# Patient Record
Sex: Male | Born: 1950 | Race: White | Hispanic: No | Smoking: Former smoker
Health system: Southern US, Community
[De-identification: ages and names within clinical notes are randomized; demographics above are authoritative.]

## PROBLEM LIST (undated history)

## (undated) DIAGNOSIS — J189 Pneumonia, unspecified organism: Secondary | ICD-10-CM

## (undated) DIAGNOSIS — R42 Dizziness and giddiness: Secondary | ICD-10-CM

## (undated) DIAGNOSIS — L729 Follicular cyst of the skin and subcutaneous tissue, unspecified: Secondary | ICD-10-CM

## (undated) DIAGNOSIS — R202 Paresthesia of skin: Secondary | ICD-10-CM

## (undated) DIAGNOSIS — Z9289 Personal history of other medical treatment: Secondary | ICD-10-CM

## (undated) DIAGNOSIS — J449 Chronic obstructive pulmonary disease, unspecified: Secondary | ICD-10-CM

## (undated) DIAGNOSIS — F329 Major depressive disorder, single episode, unspecified: Secondary | ICD-10-CM

## (undated) DIAGNOSIS — Z8709 Personal history of other diseases of the respiratory system: Secondary | ICD-10-CM

## (undated) DIAGNOSIS — R51 Headache: Secondary | ICD-10-CM

## (undated) DIAGNOSIS — M1990 Unspecified osteoarthritis, unspecified site: Secondary | ICD-10-CM

## (undated) DIAGNOSIS — R233 Spontaneous ecchymoses: Secondary | ICD-10-CM

## (undated) DIAGNOSIS — I219 Acute myocardial infarction, unspecified: Secondary | ICD-10-CM

## (undated) DIAGNOSIS — R238 Other skin changes: Secondary | ICD-10-CM

## (undated) DIAGNOSIS — M549 Dorsalgia, unspecified: Secondary | ICD-10-CM

## (undated) DIAGNOSIS — I639 Cerebral infarction, unspecified: Secondary | ICD-10-CM

## (undated) DIAGNOSIS — R351 Nocturia: Secondary | ICD-10-CM

## (undated) DIAGNOSIS — F32A Depression, unspecified: Secondary | ICD-10-CM

## (undated) DIAGNOSIS — R454 Irritability and anger: Secondary | ICD-10-CM

## (undated) HISTORY — PX: LEG SURGERY: SHX1003

---

## 1999-04-09 ENCOUNTER — Encounter: Payer: Self-pay | Admitting: Emergency Medicine

## 1999-04-09 ENCOUNTER — Emergency Department (HOSPITAL_COMMUNITY): Admission: EM | Admit: 1999-04-09 | Discharge: 1999-04-09 | Payer: Self-pay | Admitting: Emergency Medicine

## 2004-06-16 ENCOUNTER — Emergency Department (HOSPITAL_COMMUNITY): Admission: EM | Admit: 2004-06-16 | Discharge: 2004-06-16 | Payer: Self-pay

## 2013-06-10 DIAGNOSIS — J189 Pneumonia, unspecified organism: Secondary | ICD-10-CM

## 2013-06-10 HISTORY — DX: Pneumonia, unspecified organism: J18.9

## 2013-07-10 ENCOUNTER — Other Ambulatory Visit (HOSPITAL_COMMUNITY): Payer: Self-pay | Admitting: Interventional Radiology

## 2013-07-10 DIAGNOSIS — F1010 Alcohol abuse, uncomplicated: Secondary | ICD-10-CM

## 2013-07-11 ENCOUNTER — Ambulatory Visit (HOSPITAL_COMMUNITY)
Admission: RE | Admit: 2013-07-11 | Discharge: 2013-07-11 | Disposition: A | Discharge status: Home / Self Care | Payer: BC Managed Care – PPO | Source: Ambulatory Visit | Attending: Interventional Radiology | Admitting: Interventional Radiology

## 2013-07-11 DIAGNOSIS — F1010 Alcohol abuse, uncomplicated: Secondary | ICD-10-CM

## 2013-09-12 ENCOUNTER — Other Ambulatory Visit (HOSPITAL_COMMUNITY): Payer: Self-pay | Admitting: Interventional Radiology

## 2013-09-12 DIAGNOSIS — I6509 Occlusion and stenosis of unspecified vertebral artery: Secondary | ICD-10-CM

## 2013-09-12 DIAGNOSIS — I6529 Occlusion and stenosis of unspecified carotid artery: Secondary | ICD-10-CM

## 2013-09-13 ENCOUNTER — Other Ambulatory Visit (HOSPITAL_COMMUNITY): Payer: Self-pay | Admitting: Radiology

## 2013-09-14 ENCOUNTER — Other Ambulatory Visit (HOSPITAL_COMMUNITY): Payer: Self-pay | Admitting: Radiology

## 2013-09-18 ENCOUNTER — Encounter (HOSPITAL_COMMUNITY): Payer: Self-pay | Admitting: Pharmacist

## 2013-09-19 ENCOUNTER — Encounter (HOSPITAL_COMMUNITY): Payer: Self-pay

## 2013-09-19 ENCOUNTER — Other Ambulatory Visit (HOSPITAL_COMMUNITY): Payer: Self-pay | Admitting: Interventional Radiology

## 2013-09-19 ENCOUNTER — Ambulatory Visit (HOSPITAL_COMMUNITY)
Admission: RE | Admit: 2013-09-19 | Discharge: 2013-09-19 | Disposition: A | Discharge status: Home / Self Care | Payer: BC Managed Care – PPO | Source: Ambulatory Visit | Attending: Interventional Radiology | Admitting: Interventional Radiology

## 2013-09-19 DIAGNOSIS — I6529 Occlusion and stenosis of unspecified carotid artery: Secondary | ICD-10-CM | POA: Insufficient documentation

## 2013-09-19 DIAGNOSIS — Z87891 Personal history of nicotine dependence: Secondary | ICD-10-CM | POA: Insufficient documentation

## 2013-09-19 DIAGNOSIS — G450 Vertebro-basilar artery syndrome: Secondary | ICD-10-CM | POA: Insufficient documentation

## 2013-09-19 DIAGNOSIS — I6509 Occlusion and stenosis of unspecified vertebral artery: Secondary | ICD-10-CM

## 2013-09-19 DIAGNOSIS — Z8673 Personal history of transient ischemic attack (TIA), and cerebral infarction without residual deficits: Secondary | ICD-10-CM | POA: Insufficient documentation

## 2013-09-19 DIAGNOSIS — J4489 Other specified chronic obstructive pulmonary disease: Secondary | ICD-10-CM | POA: Insufficient documentation

## 2013-09-19 DIAGNOSIS — I2510 Atherosclerotic heart disease of native coronary artery without angina pectoris: Secondary | ICD-10-CM | POA: Insufficient documentation

## 2013-09-19 DIAGNOSIS — I252 Old myocardial infarction: Secondary | ICD-10-CM | POA: Insufficient documentation

## 2013-09-19 DIAGNOSIS — J449 Chronic obstructive pulmonary disease, unspecified: Secondary | ICD-10-CM | POA: Insufficient documentation

## 2013-09-19 HISTORY — DX: Headache: R51

## 2013-09-19 HISTORY — DX: Cerebral infarction, unspecified: I63.9

## 2013-09-19 HISTORY — DX: Acute myocardial infarction, unspecified: I21.9

## 2013-09-19 HISTORY — DX: Chronic obstructive pulmonary disease, unspecified: J44.9

## 2013-09-19 LAB — CBC WITH DIFFERENTIAL/PLATELET
BASOS PCT: 0 % (ref 0–1)
Basophils Absolute: 0 10*3/uL (ref 0.0–0.1)
EOS PCT: 4 % (ref 0–5)
Eosinophils Absolute: 0.4 10*3/uL (ref 0.0–0.7)
HEMATOCRIT: 32.3 % — AB (ref 39.0–52.0)
HEMOGLOBIN: 10.9 g/dL — AB (ref 13.0–17.0)
Lymphocytes Relative: 46 % (ref 12–46)
Lymphs Abs: 4.1 10*3/uL — ABNORMAL HIGH (ref 0.7–4.0)
MCH: 29.5 pg (ref 26.0–34.0)
MCHC: 33.7 g/dL (ref 30.0–36.0)
MCV: 87.3 fL (ref 78.0–100.0)
MONO ABS: 0.7 10*3/uL (ref 0.1–1.0)
Monocytes Relative: 8 % (ref 3–12)
Neutro Abs: 3.7 10*3/uL (ref 1.7–7.7)
Neutrophils Relative %: 42 % — ABNORMAL LOW (ref 43–77)
Platelets: 256 10*3/uL (ref 150–400)
RBC: 3.7 MIL/uL — ABNORMAL LOW (ref 4.22–5.81)
RDW: 13.9 % (ref 11.5–15.5)
WBC: 8.8 10*3/uL (ref 4.0–10.5)

## 2013-09-19 LAB — PROTIME-INR
INR: 1.07 (ref 0.00–1.49)
Prothrombin Time: 13.7 seconds (ref 11.6–15.2)

## 2013-09-19 LAB — BASIC METABOLIC PANEL
BUN: 9 mg/dL (ref 6–23)
CHLORIDE: 104 meq/L (ref 96–112)
CO2: 26 mEq/L (ref 19–32)
Calcium: 9.1 mg/dL (ref 8.4–10.5)
Creatinine, Ser: 0.69 mg/dL (ref 0.50–1.35)
GFR calc Af Amer: >90 mL/min (ref >90)
GLUCOSE: 91 mg/dL (ref 70–99)
Potassium: 4.5 mEq/L (ref 3.7–5.3)
Sodium: 140 mEq/L (ref 137–147)

## 2013-09-19 LAB — APTT: aPTT: 35 seconds (ref 24–37)

## 2013-09-19 MED ORDER — FENTANYL CITRATE 0.05 MG/ML IJ SOLN
INTRAMUSCULAR | Status: AC | PRN
  Administered 2013-09-19 (×2): 25 ug via INTRAVENOUS

## 2013-09-19 MED ORDER — SODIUM CHLORIDE 0.9 % IV SOLN
Freq: Once | INTRAVENOUS | Status: AC
  Administered 2013-09-19: 10:00:00 via INTRAVENOUS

## 2013-09-19 MED ORDER — IOHEXOL 300 MG/ML  SOLN
150.0000 mL | Freq: Once | INTRAMUSCULAR | Status: AC | PRN
  Administered 2013-09-19: 65 mL via INTRA_ARTERIAL

## 2013-09-19 MED ORDER — SODIUM CHLORIDE 0.9 % IV SOLN: INTRAVENOUS | Status: AC

## 2013-09-19 MED ORDER — MIDAZOLAM HCL 2 MG/2ML IJ SOLN
INTRAMUSCULAR | Status: AC | PRN
  Administered 2013-09-19: 1 mg via INTRAVENOUS

## 2013-09-19 MED ORDER — HEPARIN SOD (PORK) LOCK FLUSH 100 UNIT/ML IV SOLN
INTRAVENOUS | Status: AC | PRN
  Administered 2013-09-19 (×2): 500 [IU] via INTRAVENOUS

## 2013-09-19 MED ORDER — FENTANYL CITRATE 0.05 MG/ML IJ SOLN
INTRAMUSCULAR | Status: AC
  Filled 2013-09-19: qty 2

## 2013-09-19 MED ORDER — MIDAZOLAM HCL 2 MG/2ML IJ SOLN
INTRAMUSCULAR | Status: AC
  Filled 2013-09-19: qty 2

## 2013-09-19 NOTE — H&P (Signed)
Vincent MoodJohn M Schamberger is an 63 y.o. male.   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 Coronary artery disease   . Stroke   . Headache(784.0)   . Myocardial infarction   . COPD (chronic obstructive pulmonary disease)     History reviewed. No pertinent past surgical history.  History reviewed. No pertinent family history. Social History:  reports that he has quit smoking. He does not have any smokeless tobacco history on file. His alcohol and drug histories are not on file.  Allergies: No Known Allergies   (Not in a hospital admission)  Results for orders placed during the hospital encounter of 09/19/13 (from the past 48 hour(s))  APTT     Status: None   Collection Time    09/19/13 10:08 AM      Result Value Range   aPTT 35  24 - 37 seconds  CBC WITH DIFFERENTIAL     Status: Abnormal   Collection Time    09/19/13 10:08 AM      Result Value Range   WBC 8.8  4.0 - 10.5 K/uL   RBC 3.70 (*) 4.22 - 5.81 MIL/uL   Hemoglobin 10.9 (*) 13.0 - 17.0 g/dL   HCT 91.432.3 (*) 78.239.0 - 95.652.0 %   MCV 87.3  78.0 - 100.0 fL   MCH 29.5  26.0 - 34.0 pg   MCHC 33.7  30.0 - 36.0 g/dL   RDW 21.313.9  08.611.5 - 57.815.5 %   Platelets 256  150 - 400 K/uL   Neutrophils Relative % 42 (*) 43 - 77 %   Neutro Abs 3.7  1.7 - 7.7 K/uL   Lymphocytes Relative 46  12 - 46 %   Lymphs Abs 4.1 (*) 0.7 - 4.0 K/uL   Monocytes Relative 8  3 - 12 %   Monocytes Absolute  0.7  0.1 - 1.0 K/uL   Eosinophils Relative 4  0 - 5 %   Eosinophils Absolute 0.4  0.0 - 0.7 K/uL   Basophils Relative 0  0 - 1 %   Basophils Absolute 0.0  0.0 - 0.1 K/uL  PROTIME-INR     Status: None   Collection Time    09/19/13 10:08 AM      Result Value Range   Prothrombin Time 13.7  11.6 - 15.2 seconds   INR 1.07  0.00 - 1.49   No results found.  Review of Systems  Constitutional: Positive for weight loss. Negative for fever.  Eyes: Negative for blurred vision and double vision.  Respiratory: Positive for cough.   Cardiovascular: Negative for chest pain.  Gastrointestinal: Negative for nausea, vomiting and abdominal pain.  Neurological: Positive for dizziness, weakness and headaches.  Psychiatric/Behavioral: Positive for substance abuse. The patient is not nervous/anxious.     Blood pressure 130/60, pulse 86, temperature 97.5 F (36.4 C), temperature source Oral, resp. rate 16, height 5\' 8"  (1.727 m), weight 121 lb (54.885 kg), SpO2 100.00%. Physical Exam  Constitutional: He is oriented to person, place, and time. He appears well-developed.  thin  Cardiovascular: Normal rate and regular rhythm.   No murmur heard. Respiratory: Effort normal and breath sounds normal. He has no wheezes.  GI: Soft. Bowel sounds are normal. There is no tenderness.  Musculoskeletal: Normal range of motion.  Neurological: He is alert and oriented to person, place, and time.  Skin: Skin is warm and dry.  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 His behavior is normal. Judgment and thought content normal.     Assessment/Plan Pt with ataxia; dizziness and headaches x over 1 yr Hospitalized and worked up in [facility] CT and MRI reveal R ICA occl; L ICA mild stenosis; L VA 75% stenosis Consult with Dr Corliss Skains 07/2103 Now scheduled for cerebral arteriogram Pt and wife aware of procedure benefits and risks and agreeable to proceed Consent signed and in  chart  Ruchi Stoney A 09/19/2013, 10:45 AM

## 2013-09-19 NOTE — Discharge Instructions (Signed)
Angiography, Care After ° °Refer to this sheet in the next few weeks. These instructions provide you with information on caring for yourself after your procedure. Your health care provider may also give you more specific instructions. Your treatment has been planned according to current medical practices, but problems sometimes occur. Call your health care provider if you have any problems or questions after your procedure.  °WHAT TO EXPECT AFTER THE PROCEDURE °After your procedure, it is typical to have the following sensations: °· Minor discomfort or tenderness and a small bump at the catheter insertion site. The bump should usually decrease in size and tenderness within 1 to 2 weeks. °· Any bruising will usually fade within 2 to 4 weeks. °HOME CARE INSTRUCTIONS  °· You may need to keep taking blood thinners if they were prescribed for you. Only take over-the-counter or prescription medicines for pain, fever, or discomfort as directed by your health care provider. °· Do not apply powder or lotion to the site. °· Do not sit in a bathtub, swimming pool, or whirlpool for 5 to 7 days. °· You may shower 24 hours after the procedure. Remove the bandage (dressing) and gently wash the site with plain soap and water. Gently pat the site dry. °· Inspect the site at least twice daily. °· Limit your activity for the first 24 hours. Do not bend, squat, or lift anything over 10 lb (9 kg) or as directed by your health care provider. °· Do not drive home if you are discharged the day of the procedure. Have someone else drive you. Follow instructions about when you can drive or return to work. °SEEK MEDICAL CARE IF: °· You get lightheaded when standing up. °· You have drainage (other than a small amount of blood on the dressing). °· You have chills. °· You have a fever. °· You have redness, warmth, swelling, or pain at the insertion site. °SEEK IMMEDIATE MEDICAL CARE IF:  °· You develop chest pain or shortness of breath, feel  faint, or pass out. °· You have bleeding, swelling larger than a walnut, or drainage from the catheter insertion site. °· You develop pain, discoloration, coldness, or severe bruising in the leg or arm that held the catheter. °· You develop bleeding from any other place, such as the bowels. You may see bright red blood in your urine or stools, or your stools may appear black and tarry. °· You have heavy bleeding from the site. If this happens, hold pressure on the site. °MAKE SURE YOU: °· Understand these instructions. °· Will watch your condition. °· Will get help right away if you are not doing well or get worse. °Document Released: 02/12/2005 Document Revised: 03/29/2013 Document Reviewed: 12/19/2012 °ExitCare® Patient Information ©2014 [employer]. ° °

## 2013-09-19 NOTE — Procedures (Signed)
S/P 4 vessel cerebral arteriogram  RT CFa approach. Findings. 1.Occluded RT CCA . 2.Occluded RT VBJ . 3limited VA stenosis at the origin of approx  70 %

## 2013-09-22 ENCOUNTER — Other Ambulatory Visit (HOSPITAL_COMMUNITY): Payer: Self-pay | Admitting: Interventional Radiology

## 2013-09-22 DIAGNOSIS — I771 Stricture of artery: Secondary | ICD-10-CM

## 2013-09-26 ENCOUNTER — Ambulatory Visit (HOSPITAL_COMMUNITY): Admission: RE | Admit: 2013-09-26 | Payer: BC Managed Care – PPO | Source: Ambulatory Visit

## 2013-09-28 ENCOUNTER — Ambulatory Visit (HOSPITAL_COMMUNITY)
Admission: RE | Admit: 2013-09-28 | Discharge: 2013-09-28 | Disposition: A | Discharge status: Home / Self Care | Payer: BC Managed Care – PPO | Source: Ambulatory Visit | Attending: Interventional Radiology | Admitting: Interventional Radiology

## 2013-09-28 DIAGNOSIS — I771 Stricture of artery: Secondary | ICD-10-CM

## 2013-10-06 ENCOUNTER — Encounter (HOSPITAL_COMMUNITY): Payer: Self-pay | Admitting: Pharmacy Technician

## 2013-10-06 ENCOUNTER — Other Ambulatory Visit (HOSPITAL_COMMUNITY): Payer: Self-pay | Admitting: Interventional Radiology

## 2013-10-06 DIAGNOSIS — I639 Cerebral infarction, unspecified: Secondary | ICD-10-CM

## 2013-10-06 DIAGNOSIS — R55 Syncope and collapse: Secondary | ICD-10-CM

## 2013-10-09 ENCOUNTER — Telehealth (HOSPITAL_COMMUNITY): Payer: Self-pay | Admitting: Interventional Radiology

## 2013-10-09 ENCOUNTER — Encounter (HOSPITAL_COMMUNITY): Payer: Self-pay | Admitting: Pharmacy Technician

## 2013-10-09 NOTE — Telephone Encounter (Signed)
Called pt gave Jacki ConesLaurie (pt's fiance) instructions for pt's upcoming procedure. She states understanding of all of the instruction. Pt was instructed to restart his Plavix 75mg  1 q d today (10/09/13) and he is and has been on Aspirin 81mg  1 q d. Edmonia CaprioJM

## 2013-10-12 ENCOUNTER — Other Ambulatory Visit: Payer: Self-pay | Admitting: Radiology

## 2013-10-13 ENCOUNTER — Encounter (HOSPITAL_COMMUNITY)
Admission: RE | Admit: 2013-10-13 | Discharge: 2013-10-13 | Disposition: A | Discharge status: Home / Self Care | Payer: BC Managed Care – PPO | Source: Ambulatory Visit | Attending: Anesthesiology | Admitting: Anesthesiology

## 2013-10-13 ENCOUNTER — Encounter (HOSPITAL_COMMUNITY)
Admission: RE | Admit: 2013-10-13 | Discharge: 2013-10-13 | Disposition: A | Discharge status: Home / Self Care | Payer: BC Managed Care – PPO | Source: Ambulatory Visit | Attending: Interventional Radiology | Admitting: Interventional Radiology

## 2013-10-13 ENCOUNTER — Encounter (HOSPITAL_COMMUNITY): Payer: Self-pay

## 2013-10-13 HISTORY — DX: Irritability and anger: R45.4

## 2013-10-13 HISTORY — DX: Depression, unspecified: F32.A

## 2013-10-13 HISTORY — DX: Unspecified osteoarthritis, unspecified site: M19.90

## 2013-10-13 HISTORY — DX: Dorsalgia, unspecified: M54.9

## 2013-10-13 HISTORY — DX: Other skin changes: R23.8

## 2013-10-13 HISTORY — DX: Paresthesia of skin: R20.2

## 2013-10-13 HISTORY — DX: Pneumonia, unspecified organism: J18.9

## 2013-10-13 HISTORY — DX: Dizziness and giddiness: R42

## 2013-10-13 HISTORY — DX: Major depressive disorder, single episode, unspecified: F32.9

## 2013-10-13 HISTORY — DX: Nocturia: R35.1

## 2013-10-13 HISTORY — DX: Follicular cyst of the skin and subcutaneous tissue, unspecified: L72.9

## 2013-10-13 HISTORY — DX: Personal history of other medical treatment: Z92.89

## 2013-10-13 HISTORY — DX: Personal history of other diseases of the respiratory system: Z87.09

## 2013-10-13 HISTORY — DX: Spontaneous ecchymoses: R23.3

## 2013-10-13 LAB — COMPREHENSIVE METABOLIC PANEL
ALK PHOS: 113 U/L (ref 39–117)
ALT: 13 U/L (ref 0–53)
AST: 13 U/L (ref 0–37)
Albumin: 3.8 g/dL (ref 3.5–5.2)
BILIRUBIN TOTAL: 0.3 mg/dL (ref 0.3–1.2)
BUN: 10 mg/dL (ref 6–23)
CHLORIDE: 102 meq/L (ref 96–112)
CO2: 26 meq/L (ref 19–32)
Calcium: 9.5 mg/dL (ref 8.4–10.5)
Creatinine, Ser: 0.63 mg/dL (ref 0.50–1.35)
GFR calc non Af Amer: >90 mL/min (ref >90)
GLUCOSE: 121 mg/dL — AB (ref 70–99)
POTASSIUM: 5.4 meq/L — AB (ref 3.7–5.3)
SODIUM: 142 meq/L (ref 137–147)
Total Protein: 7.4 g/dL (ref 6.0–8.3)

## 2013-10-13 LAB — CBC WITH DIFFERENTIAL/PLATELET
BASOS ABS: 0 10*3/uL (ref 0.0–0.1)
Basophils Relative: 0 % (ref 0–1)
Eosinophils Absolute: 0 10*3/uL (ref 0.0–0.7)
Eosinophils Relative: 0 % (ref 0–5)
HCT: 37.1 % — ABNORMAL LOW (ref 39.0–52.0)
HEMOGLOBIN: 12.4 g/dL — AB (ref 13.0–17.0)
LYMPHS ABS: 3 10*3/uL (ref 0.7–4.0)
LYMPHS PCT: 29 % (ref 12–46)
MCH: 29.5 pg (ref 26.0–34.0)
MCHC: 33.4 g/dL (ref 30.0–36.0)
MCV: 88.3 fL (ref 78.0–100.0)
MONO ABS: 0.3 10*3/uL (ref 0.1–1.0)
MONOS PCT: 3 % (ref 3–12)
NEUTROS ABS: 6.9 10*3/uL (ref 1.7–7.7)
Neutrophils Relative %: 67 % (ref 43–77)
Platelets: 292 10*3/uL (ref 150–400)
RBC: 4.2 MIL/uL — AB (ref 4.22–5.81)
RDW: 13.9 % (ref 11.5–15.5)
WBC: 10.2 10*3/uL (ref 4.0–10.5)

## 2013-10-13 LAB — APTT: APTT: 32 s (ref 24–37)

## 2013-10-13 LAB — PROTIME-INR
INR: 0.98 (ref 0.00–1.49)
Prothrombin Time: 12.8 seconds (ref 11.6–15.2)

## 2013-10-13 NOTE — Progress Notes (Addendum)
Cardiologist with [facility]-was in hospital in Nov 2014-cardiologist saw him there; to request last office visit  Stress test done [facility] 3[facility]6544yrs [facility] [address]  Unsure of echo being done but to [facility]   Denies ever having a heart cath  Medical Md is Dr.Lakshmi Paruchuri   CXR has been done-called [facility] and they have gone for the day CXR to be done today  [facility]

## 2013-10-13 NOTE — Pre-Procedure Instructions (Signed)
Vincent MoodJohn M Payne  10/13/2013   Your procedure is scheduled on:  Mon, Mar 9 @ 8:00 AM  [facility]  at 6:00 AM.  Call this number if you have problems the morning of surgery: [phone]   Remember:   Do not eat food or drink liquids after midnight.   Take these medicines the morning of surgery with A SIP OF WATER: Pain Pill(if needed),Aspirin,and Plavix              No Goody's,BC's,Aleve,Ibuprofen,Fish Oil,or any Herbal Medications   Do not wear jewelry  Do not wear lotions, powders, or colognes. You may wear deodorant.  Men may shave face and neck.  Do not bring valuables to the hospital.  [facility] is not responsible                  for any belongings or valuables.               Contacts, dentures or bridgework may not be worn into surgery.  Leave suitcase in the car. After surgery it may be brought to your room.  For patients admitted to the hospital, discharge time is determined by your                treatment team.               Patients discharged the day of surgery will not be allowed to drive  home.    Special Instructions:  [place] - Preparing for Surgery  Before surgery, you can play an important role.  Because skin is not sterile, your skin needs to be as free of germs as possible.  You can reduce the number of germs on you skin by washing with CHG (chlorahexidine gluconate) soap before surgery.  CHG is an antiseptic cleaner which kills germs and bonds with the skin to continue killing germs even after washing.  Please DO NOT use if you have an allergy to CHG or antibacterial soaps.  If your skin becomes reddened/irritated stop using the CHG and inform your nurse when you arrive at Short Stay.  Do not shave (including legs and underarms) for at least 48 hours prior to the first CHG shower.  You may shave your face.  Please follow these instructions carefully:   1.  Shower with CHG Soap the night before surgery and the                                 morning of Surgery.  2.  If you choose to wash your hair, wash your hair first as usual with your       normal shampoo.  3.  After you shampoo, rinse your hair and body thoroughly to remove the                      Shampoo.  4.  Use CHG as you would any other liquid soap.  You can apply chg directly       to the skin and wash gently with scrungie or a clean washcloth.  5.  Apply the CHG Soap to your body ONLY FROM THE NECK DOWN.        Do not use on open wounds or open sores.  Avoid contact with your eyes,       ears, mouth and genitals (private parts).  Wash genitals (private  parts)       with your normal soap.  6.  Wash thoroughly, paying special attention to the area where your surgery        will be performed.  7.  Thoroughly rinse your body with warm water from the neck down.  8.  DO NOT shower/wash with your normal soap after using and rinsing off       the CHG Soap.  9.  Pat yourself dry with a clean towel.            10.  Wear clean pajamas.            11.  Place clean sheets on your bed the night of your first shower and do not        sleep with pets.  Day of Surgery  Do not apply any lotions/deoderants the morning of surgery.  Please wear clean clothes to the hospital/surgery center.     Please read over the following fact sheets that you were given: Pain Booklet, Coughing and Deep Breathing and Surgical Site Infection Prevention

## 2013-10-16 ENCOUNTER — Encounter (HOSPITAL_COMMUNITY): Payer: Self-pay | Admitting: Anesthesiology

## 2013-10-16 ENCOUNTER — Ambulatory Visit (HOSPITAL_COMMUNITY): Payer: BC Managed Care – PPO | Admitting: Certified Registered Nurse Anesthetist

## 2013-10-16 ENCOUNTER — Encounter (HOSPITAL_COMMUNITY)
Admission: RE | Disposition: A | Discharge status: Home / Self Care | Payer: Self-pay | Source: Ambulatory Visit | Attending: Interventional Radiology

## 2013-10-16 ENCOUNTER — Ambulatory Visit (HOSPITAL_COMMUNITY)
Admission: RE | Admit: 2013-10-16 | Discharge: 2013-10-16 | Disposition: A | Discharge status: Home / Self Care | Payer: BC Managed Care – PPO | Source: Ambulatory Visit | Attending: Interventional Radiology | Admitting: Interventional Radiology

## 2013-10-16 ENCOUNTER — Encounter (HOSPITAL_COMMUNITY): Payer: Self-pay

## 2013-10-16 ENCOUNTER — Encounter (HOSPITAL_COMMUNITY): Payer: BC Managed Care – PPO | Admitting: Certified Registered Nurse Anesthetist

## 2013-10-16 ENCOUNTER — Observation Stay (HOSPITAL_COMMUNITY)
Admission: RE | Admit: 2013-10-16 | Discharge: 2013-10-17 | Disposition: A | Discharge status: Home / Self Care | Payer: BC Managed Care – PPO | Source: Ambulatory Visit | Attending: Interventional Radiology | Admitting: Interventional Radiology

## 2013-10-16 DIAGNOSIS — R55 Syncope and collapse: Secondary | ICD-10-CM

## 2013-10-16 DIAGNOSIS — I6509 Occlusion and stenosis of unspecified vertebral artery: Principal | ICD-10-CM | POA: Insufficient documentation

## 2013-10-16 DIAGNOSIS — I639 Cerebral infarction, unspecified: Secondary | ICD-10-CM

## 2013-10-16 DIAGNOSIS — I2510 Atherosclerotic heart disease of native coronary artery without angina pectoris: Secondary | ICD-10-CM | POA: Insufficient documentation

## 2013-10-16 DIAGNOSIS — F3289 Other specified depressive episodes: Secondary | ICD-10-CM | POA: Insufficient documentation

## 2013-10-16 DIAGNOSIS — J4489 Other specified chronic obstructive pulmonary disease: Secondary | ICD-10-CM | POA: Insufficient documentation

## 2013-10-16 DIAGNOSIS — Z01812 Encounter for preprocedural laboratory examination: Secondary | ICD-10-CM | POA: Insufficient documentation

## 2013-10-16 DIAGNOSIS — Z87891 Personal history of nicotine dependence: Secondary | ICD-10-CM | POA: Insufficient documentation

## 2013-10-16 DIAGNOSIS — I252 Old myocardial infarction: Secondary | ICD-10-CM | POA: Insufficient documentation

## 2013-10-16 DIAGNOSIS — I771 Stricture of artery: Secondary | ICD-10-CM | POA: Insufficient documentation

## 2013-10-16 DIAGNOSIS — Z8673 Personal history of transient ischemic attack (TIA), and cerebral infarction without residual deficits: Secondary | ICD-10-CM | POA: Insufficient documentation

## 2013-10-16 DIAGNOSIS — Z01818 Encounter for other preprocedural examination: Secondary | ICD-10-CM | POA: Insufficient documentation

## 2013-10-16 DIAGNOSIS — F329 Major depressive disorder, single episode, unspecified: Secondary | ICD-10-CM | POA: Insufficient documentation

## 2013-10-16 DIAGNOSIS — J449 Chronic obstructive pulmonary disease, unspecified: Secondary | ICD-10-CM | POA: Insufficient documentation

## 2013-10-16 HISTORY — PX: RADIOLOGY WITH ANESTHESIA: SHX6223

## 2013-10-16 LAB — POCT ACTIVATED CLOTTING TIME
ACTIVATED CLOTTING TIME: 177 s
ACTIVATED CLOTTING TIME: 193 s
ACTIVATED CLOTTING TIME: 177 s

## 2013-10-16 LAB — POCT I-STAT 4, (NA,K, GLUC, HGB,HCT)
Glucose, Bld: 98 mg/dL (ref 70–99)
HCT: 33 % — ABNORMAL LOW (ref 39.0–52.0)
Hemoglobin: 11.2 g/dL — ABNORMAL LOW (ref 13.0–17.0)
Potassium: 4 mEq/L (ref 3.7–5.3)
Sodium: 140 mEq/L (ref 137–147)

## 2013-10-16 LAB — MRSA PCR SCREENING: MRSA by PCR: NEGATIVE

## 2013-10-16 LAB — PLATELET INHIBITION P2Y12: Platelet Function  P2Y12: 173 [PRU] — ABNORMAL LOW (ref 194–418)

## 2013-10-16 LAB — HEPARIN LEVEL (UNFRACTIONATED): Heparin Unfractionated: <0.1 IU/mL — ABNORMAL LOW (ref 0.30–0.70)

## 2013-10-16 SURGERY — RADIOLOGY WITH ANESTHESIA
Anesthesia: Monitor Anesthesia Care

## 2013-10-16 MED ORDER — HYDROMORPHONE HCL PF 1 MG/ML IJ SOLN
INTRAMUSCULAR | Status: AC
  Filled 2013-10-16: qty 1

## 2013-10-16 MED ORDER — HEPARIN SODIUM (PORCINE) 1000 UNIT/ML IJ SOLN
INTRAMUSCULAR | Status: DC | PRN
  Administered 2013-10-16: 3000 [IU] via INTRAVENOUS

## 2013-10-16 MED ORDER — ONDANSETRON HCL 4 MG/2ML IJ SOLN: 4.0000 mg | Freq: Once | INTRAMUSCULAR | Status: DC | PRN

## 2013-10-16 MED ORDER — HYDROMORPHONE HCL PF 1 MG/ML IJ SOLN: 0.2500 mg | INTRAMUSCULAR | Status: DC | PRN

## 2013-10-16 MED ORDER — NICARDIPINE HCL IN NACL 20-0.86 MG/200ML-% IV SOLN
5.0000 mg/h | INTRAVENOUS | Status: DC
Start: 2013-10-16 — End: 2013-10-17

## 2013-10-16 MED ORDER — ONDANSETRON HCL 4 MG/2ML IJ SOLN: 4.0000 mg | Freq: Four times a day (QID) | INTRAMUSCULAR | Status: DC | PRN

## 2013-10-16 MED ORDER — CLOPIDOGREL BISULFATE 75 MG PO TABS
75.0000 mg | ORAL_TABLET | ORAL | Status: DC
  Filled 2013-10-16: qty 1

## 2013-10-16 MED ORDER — NIMODIPINE 30 MG PO CAPS
60.0000 mg | ORAL_CAPSULE | ORAL | Status: DC
  Filled 2013-10-16: qty 2

## 2013-10-16 MED ORDER — LACTATED RINGERS IV SOLN
INTRAVENOUS | Status: DC
  Administered 2013-10-16: 07:00:00 via INTRAVENOUS

## 2013-10-16 MED ORDER — HYDROMORPHONE HCL PF 1 MG/ML IJ SOLN
0.2500 mg | INTRAMUSCULAR | Status: DC | PRN
  Administered 2013-10-16: 0.5 mg via INTRAVENOUS

## 2013-10-16 MED ORDER — ACETAMINOPHEN 650 MG RE SUPP: 650.0000 mg | Freq: Four times a day (QID) | RECTAL | Status: DC | PRN

## 2013-10-16 MED ORDER — SODIUM CHLORIDE 0.9 % IV SOLN: Freq: Once | INTRAVENOUS | Status: DC

## 2013-10-16 MED ORDER — MIDAZOLAM HCL 5 MG/5ML IJ SOLN
INTRAMUSCULAR | Status: DC | PRN
  Administered 2013-10-16 (×2): 1 mg via INTRAVENOUS
  Administered 2013-10-16: 2 mg via INTRAVENOUS

## 2013-10-16 MED ORDER — SODIUM CHLORIDE 0.9 % IV SOLN
INTRAVENOUS | Status: DC
  Administered 2013-10-16 – 2013-10-17 (×2): via INTRAVENOUS

## 2013-10-16 MED ORDER — PHENYLEPHRINE HCL 10 MG/ML IJ SOLN
INTRAMUSCULAR | Status: DC | PRN
  Administered 2013-10-16: 80 ug via INTRAVENOUS
  Administered 2013-10-16 (×3): 40 ug via INTRAVENOUS

## 2013-10-16 MED ORDER — IOHEXOL 300 MG/ML  SOLN
150.0000 mL | Freq: Once | INTRAMUSCULAR | Status: AC | PRN
  Administered 2013-10-16: 90 mL via INTRAVENOUS

## 2013-10-16 MED ORDER — CEFAZOLIN SODIUM-DEXTROSE 2-3 GM-% IV SOLR
2.0000 g | Freq: Once | INTRAVENOUS | Status: AC
Start: 2013-10-16 — End: 2013-10-16
  Administered 2013-10-16: 2 g via INTRAVENOUS
  Filled 2013-10-16: qty 50

## 2013-10-16 MED ORDER — ASPIRIN EC 325 MG PO TBEC
325.0000 mg | DELAYED_RELEASE_TABLET | ORAL | Status: AC
  Administered 2013-10-16: 325 mg via ORAL
  Filled 2013-10-16: qty 1

## 2013-10-16 MED ORDER — FENTANYL CITRATE 0.05 MG/ML IJ SOLN
INTRAMUSCULAR | Status: DC | PRN
  Administered 2013-10-16 (×2): 50 ug via INTRAVENOUS

## 2013-10-16 MED ORDER — ASPIRIN 325 MG PO TABS
325.0000 mg | ORAL_TABLET | Freq: Every day | ORAL | Status: DC
  Administered 2013-10-17: 325 mg via ORAL
  Filled 2013-10-16 (×2): qty 1

## 2013-10-16 MED ORDER — ACETAMINOPHEN 500 MG PO TABS
1000.0000 mg | ORAL_TABLET | Freq: Four times a day (QID) | ORAL | Status: DC | PRN
  Administered 2013-10-16 – 2013-10-17 (×2): 1000 mg via ORAL
  Filled 2013-10-16 (×2): qty 2

## 2013-10-16 MED ORDER — DEXTROSE 5 % IV SOLN
10.0000 mg | INTRAVENOUS | Status: DC | PRN
  Administered 2013-10-16: 10 ug/min via INTRAVENOUS

## 2013-10-16 MED ORDER — HEPARIN (PORCINE) IN NACL 100-0.45 UNIT/ML-% IJ SOLN
700.0000 [IU]/h | INTRAMUSCULAR | Status: AC
  Administered 2013-10-16: 500 [IU]/h via INTRAVENOUS
  Administered 2013-10-17: 700 [IU]/h via INTRAVENOUS
  Filled 2013-10-16: qty 250

## 2013-10-16 MED ORDER — CLOPIDOGREL BISULFATE 75 MG PO TABS
75.0000 mg | ORAL_TABLET | Freq: Every day | ORAL | Status: DC
  Administered 2013-10-17: 75 mg via ORAL
  Filled 2013-10-16 (×2): qty 1

## 2013-10-16 MED ORDER — NICARDIPINE HCL IN NACL 20-0.86 MG/200ML-% IV SOLN
INTRAVENOUS | Status: DC | PRN
  Administered 2013-10-16: 5 mg/h via INTRAVENOUS

## 2013-10-16 NOTE — Progress Notes (Signed)
ANTICOAGULATION CONSULT NOTE - Follow-Up Consult  Pharmacy Consult for Heparin Indication: L vertebral artery stent placement  No Known Allergies  Patient Measurements: Height: 5\' 8"  (172.7 cm) Weight: 119 lb 0.8 oz (54 kg) IBW/kg (Calculated) : 68.4 Heparin Dosing Weight: 56.7 kg  Vital Signs: Temp: 98.4 F (36.9 C) (03/09 1600) Temp src: Oral (03/09 1600) BP: 108/61 mmHg (03/09 1800) Pulse Rate: 95 (03/09 1800)  Labs:  Recent Labs  10/16/13 0921 10/16/13 1700  HGB 11.2*  --   HCT 33.0*  --   HEPARINUNFRC  --  <0.10*    Estimated Creatinine Clearance: 73.1 ml/min (by C-G formula based on Cr of 0.63).   Medical History: Past Medical History  Diagnosis Date  . Headache(784.0)   . Myocardial infarction 8620yrs ago  . COPD (chronic obstructive pulmonary disease)   . Pneumonia 06/2013  . History of bronchitis   . Dizziness   . Stroke     left sided weakness  . Tingling     weakness to left hand  . Arthritis   . Back pain     d/t broke back  . Nocturia   . Bruises easily   . History of blood transfusion     no abnormal reaction noted  . Depression     but doesn't take any meds  . Feeling angry     all the time over nothing  . Skin cyst     on right cheek--large    Medications:  Prescriptions prior to admission  Medication Sig Dispense Refill  . aspirin 81 MG chewable tablet Chew 81 mg by mouth daily.      . clopidogrel (PLAVIX) 75 MG tablet Take 75 mg by mouth daily with breakfast.      . Multiple Vitamins-Minerals (MULTIVITAL) CHEW Chew 2 tablets by mouth daily.      . OxyCODONE (OXYCONTIN) 10 mg T12A 12 hr tablet Take 10 mg by mouth 2 (two) times daily. For 10 days      . oxyCODONE-acetaminophen (PERCOCET/ROXICET) 5-325 MG per tablet Take 1 tablet by mouth every 6 (six) hours as needed for moderate pain or severe pain.        Assessment: dizziness; ataxia; headaches x 1 yr Cerebral arteriogram performed 09/19/2013:  Occl Rt common carotid and Rt  Vert/Basilar junction; 70% stenosis Left Vertebral artery. 3/9 for cerebral arteriogram with probable angioplasty/stent of L VA.  Initial heparin level undetectable on 500 units/hr.   Goal of Therapy:  Heparin level 0.1-0.25 units/ml Monitor platelets by anticoagulation protocol: Yes   Plan:  Increase IV heparin to 600 units/hr. Recheck heparin level in 6 hrs.  Tad MooreJessica Sundra Haddix, Pharm D, BCPS  Clinical Pharmacist Pager [phone]  10/16/2013 7:49 PM

## 2013-10-16 NOTE — Procedures (Signed)
S/P Lt vertebral artery stent asissted  Angioplasty of symptomatic stenosis of Lt VA origin

## 2013-10-16 NOTE — Progress Notes (Signed)
ANTICOAGULATION CONSULT NOTE - Initial Consult  Pharmacy Consult for Heparin Indication: L vertebral artery stent placement  No Known Allergies  Patient Measurements:   Heparin Dosing Weight: 56.7 kg  Vital Signs: Temp: 98 F (36.7 C) (03/09 0605) Temp src: Oral (03/09 0605) BP: 100/52 mmHg (03/09 1215) Pulse Rate: 82 (03/09 1215)  Labs:  Recent Labs  10/13/13 1445 10/16/13 0921  HGB 12.4* 11.2*  HCT 37.1* 33.0*  PLT 292  --   APTT 32  --   LABPROT 12.8  --   INR 0.98  --   CREATININE 0.63  --     The CrCl is unknown because both a height and weight (above a minimum accepted value) are required for this calculation.   Medical History: Past Medical History  Diagnosis Date  . Headache(784.0)   . Myocardial infarction 4076yrs ago  . COPD (chronic obstructive pulmonary disease)   . Pneumonia 06/2013  . History of bronchitis   . Dizziness   . Stroke     left sided weakness  . Tingling     weakness to left hand  . Arthritis   . Back pain     d/t broke back  . Nocturia   . Bruises easily   . History of blood transfusion     no abnormal reaction noted  . Depression     but doesn't take any meds  . Feeling angry     all the time over nothing  . Skin cyst     on right cheek--large    Medications:  Prescriptions prior to admission  Medication Sig Dispense Refill  . aspirin 81 MG chewable tablet Chew 81 mg by mouth daily.      . clopidogrel (PLAVIX) 75 MG tablet Take 75 mg by mouth daily with breakfast.      . Multiple Vitamins-Minerals (MULTIVITAL) CHEW Chew 2 tablets by mouth daily.      . OxyCODONE (OXYCONTIN) 10 mg T12A 12 hr tablet Take 10 mg by mouth 2 (two) times daily. For 10 days      . oxyCODONE-acetaminophen (PERCOCET/ROXICET) 5-325 MG per tablet Take 1 tablet by mouth every 6 (six) hours as needed for moderate pain or severe pain.        Assessment: dizziness; ataxia; headaches x 1 yr Cerebral arteriogram performed 09/19/2013:  Occl Rt common  carotid and Rt Vert/Basilar junction; 70% stenosis Left Vertebral artery. 3/9 for cerebral arteriogram with probable angioplasty/stent of L VA.   Goal of Therapy:  Heparin level 0.1-0.25 units/ml Monitor platelets by anticoagulation protocol: Yes   Plan:  Heparin at 95ml/hr (8.8 units/hr) ok. Check heparin level in 6 hrs and in AM   Vincent Payne, PharmD, BCPS Clinical Staff Pharmacist Pager [phone]  Vincent Payne, Vincent Payne 10/16/2013,12:28 PM

## 2013-10-16 NOTE — Progress Notes (Signed)
Subjective: Pt see post procedure. Resting comfortably. Denies headache or nausea. Has had a few ice chips.  Objective: Physical Exam: BP 99/49  Pulse 72  Temp(Src) 97 F (36.1 C) (Oral)  Resp 16  Ht 5\' 8"  (1.727 m)  Wt 119 lb 0.8 oz (54 kg)  BMI 18.11 kg/m2  SpO2 100% (R)UE cuff and (L)UE art line BPs not coordinating. Awake and Alert, speech normal EOMI, PERRLA Face symmetric, tongue midline No UE drift. Nl finger to nose (B), a little slow Normal UE and LE strength. Rt groin soft, no hematoma, dressing c/d/i/ Feet warm, good distal pulses.    Labs: CBC  Recent Labs  10/16/13 0921  HGB 11.2*  HCT 33.0*   BMET  Recent Labs  10/16/13 0921  NA 140  K 4.0  GLUCOSE 98   LFT No results found for this basename: PROT, ALBUMIN, AST, ALT, ALKPHOS, BILITOT, BILIDIR, IBILI, LIPASE,  in the last 72 hours PT/INR No results found for this basename: LABPROT, INR,  in the last 72 hours   Studies/Results: No results found.  Assessment/Plan: S/p (L)VA stent angioplasty DC a-line, has (L)subclavian stenosis that is likely affecting (L)UE BPs Foley out later tonight if pt desires Cont heparin Liquids diet only. Hopefully home tomorrow.    LOS: 0 days    Brayton ElBRUNING, Hennessey Cantrell PA-C 10/16/2013 3:25 PM

## 2013-10-16 NOTE — Anesthesia Preprocedure Evaluation (Signed)
Anesthesia Evaluation  Patient identified by MRN, date of birth, ID band Patient awake    Reviewed: Allergy & Precautions, H&P , NPO status   Airway       Dental   Pulmonary COPDformer smoker,          Cardiovascular + CAD and + Past MI     Neuro/Psych  Headaches, Depression CVA    GI/Hepatic   Endo/Other    Renal/GU      Musculoskeletal   Abdominal   Peds  Hematology   Anesthesia Other Findings   Reproductive/Obstetrics                           Anesthesia Physical Anesthesia Plan  ASA: III  Anesthesia Plan: General and MAC   Post-op Pain Management:    Induction: Intravenous  Airway Management Planned: Oral ETT  Additional Equipment: Arterial line  Intra-op Plan:   Post-operative Plan: Possible Post-op intubation/ventilation  Informed Consent: I have reviewed the patients History and Physical, chart, labs and discussed the procedure including the risks, benefits and alternatives for the proposed anesthesia with the patient or authorized representative who has indicated his/her understanding and acceptance.     Plan Discussed with:   Anesthesia Plan Comments:         Anesthesia Quick Evaluation

## 2013-10-16 NOTE — Progress Notes (Signed)
Rt groin dressing cdi,

## 2013-10-16 NOTE — Progress Notes (Signed)
Beckey DowningPam Turpin notified that we were unable to obtain any urine when attempted to place foley catheter,catheter was not left in .place.

## 2013-10-16 NOTE — Preoperative (Signed)
Beta Blockers   Reason not to administer Beta Blockers:Not Applicable 

## 2013-10-16 NOTE — Transfer of Care (Signed)
Immediate Anesthesia Transfer of Care Note  Patient: Vincent Payne  Procedure(s) Performed: Procedure(s): RADIOLOGY WITH ANESTHESIA (N/A)  Patient Location: PACU  Anesthesia Type:MAC  Level of Consciousness: awake, alert , oriented and patient cooperative  Airway & Oxygen Therapy: Patient Spontanous Breathing  Post-op Assessment: Report given to PACU RN, Post -op Vital signs reviewed and stable and Patient moving all extremities  Post vital signs: Reviewed and stable  Complications: No apparent anesthesia complications

## 2013-10-16 NOTE — Progress Notes (Signed)
UR completed.  Kaye Luoma, RN BSN MHA CCM Trauma/Neuro ICU Case Manager [phone]  

## 2013-10-16 NOTE — Anesthesia Procedure Notes (Signed)
Procedure Name: MAC Date/Time: 10/16/2013 8:59 AM Performed by: Jerilee HohMUMM, Gift Rueckert N Pre-anesthesia Checklist: Patient identified, Emergency Drugs available, Suction available and Patient being monitored Patient Re-evaluated:Patient Re-evaluated prior to inductionOxygen Delivery Method: Simple face mask Intubation Type: IV induction Placement Confirmation: positive ETCO2 and breath sounds checked- equal and bilateral

## 2013-10-16 NOTE — H&P (Signed)
Vincent Payne is an 63 y.o. male.   Chief Complaint: dizziness; ataxia; headaches x 1 yr Symptoms worsened and [facility] Was referred to Dr Corliss Skainseveshwar: Cerebral arteriogram performed 09/19/2013 Occl Rt common carotid and Rt Vert/Basilar junction; 70% stenosis Left Vertebral artery Scheduled now for cerebral arteriogram with probable angioplasty/stent of L VA. Has been on Plavix and ASA x 5 days  HPI: Headache; CAD/MI; COPD; Dizziness; CVA  Past Medical History  Diagnosis Date  . Headache(784.0)   . Myocardial infarction 6668yrs ago  . COPD (chronic obstructive pulmonary disease)   . Pneumonia 06/2013  . History of bronchitis   . Dizziness   . Stroke     left sided weakness  . Tingling     weakness to left hand  . Arthritis   . Back pain     d/t broke back  . Nocturia   . Bruises easily   . History of blood transfusion     no abnormal reaction noted  . Depression     but doesn't take any meds  . Feeling angry     all the time over nothing  . Skin cyst     on right cheek--large    Past Surgical History  Procedure Laterality Date  . Leg surgery Left 30+yrs ago    No family history on file. Social History:  reports that he has quit smoking. He does not have any smokeless tobacco history on file. His alcohol and drug histories are not on file.  Allergies: No Known Allergies   (Not in a hospital admission)  Results for orders placed during the hospital encounter of 10/16/13 (from the past 48 hour(s))  PLATELET INHIBITION P2Y12     Status: Abnormal   Collection Time    10/16/13  6:20 AM      Result Value Ref Range   Platelet Function  P2Y12 173 (*) 194 - 418 PRU   Comment:            The literature has shown a direct     correlation of PRU values over     230 with higher risks of     thrombotic events.  Lower PRU     values are associated with     platelet inhibition.   No results found.  Review of Systems  Constitutional: Positive for  weight loss and malaise/fatigue. Negative for fever.  HENT: Negative for tinnitus.   Eyes: Negative for double vision.  Respiratory: Negative for cough and shortness of breath.   Cardiovascular: Negative for chest pain.  Gastrointestinal: Negative for nausea, vomiting and abdominal pain.  Musculoskeletal: Positive for back pain.  Neurological: Positive for dizziness and headaches.  Psychiatric/Behavioral: Negative for depression. The patient is not nervous/anxious.     There were no vitals taken for this visit. Physical Exam  Constitutional: He is oriented to person, place, and time. He appears well-developed.  thin  HENT:  Facial neurofibroma  Eyes: EOM are normal.  Neck: Normal range of motion.  Cardiovascular: Normal rate and regular rhythm.   No murmur heard. Respiratory: Effort normal and breath sounds normal.  GI: Soft. Bowel sounds are normal. There is no tenderness.  Musculoskeletal: Normal range of motion.  Neurological: He is alert and oriented to person, place, and time.  Skin: Skin is warm and dry.  Psychiatric: He has a normal mood and affect. His behavior is normal. Judgment and thought content normal.     Assessment/Plan L vertebral  artery stenosis confirmed by arteriogram 09/19/13 Hx CVA; dizziness; ataxia; headaches Scheduled for cerebral arteriogram with probable pta/stent L VA Pt aware of procedure benefits and risks and agreeable to proceed Consent signed and in chart Pt aware if intervention is performed he will be admitted to Neuro ICU overnight Plan for dc in am  Mikea Quadros A 10/16/2013, 7:47 AM

## 2013-10-16 NOTE — Anesthesia Postprocedure Evaluation (Signed)
  Anesthesia Post-op Note  Patient: Vincent Payne  Procedure(s) Performed: Procedure(s): RADIOLOGY WITH ANESTHESIA (N/A)  Patient Location: PACU  Anesthesia Type:MAC  Level of Consciousness: awake, alert  and oriented  Airway and Oxygen Therapy: Patient Spontanous Breathing  Post-op Pain: mild  Post-op Assessment: Post-op Vital signs reviewed, Patient's Cardiovascular Status Stable, Respiratory Function Stable, Patent Airway, No signs of Nausea or vomiting and Pain level controlled  Post-op Vital Signs: Reviewed and stable  Complications: No apparent anesthesia complications

## 2013-10-17 ENCOUNTER — Encounter (HOSPITAL_COMMUNITY): Payer: Self-pay | Admitting: Interventional Radiology

## 2013-10-17 LAB — CBC WITH DIFFERENTIAL/PLATELET
Basophils Absolute: 0 10*3/uL (ref 0.0–0.1)
Basophils Relative: 0 % (ref 0–1)
EOS ABS: 0.3 10*3/uL (ref 0.0–0.7)
Eosinophils Relative: 3 % (ref 0–5)
HCT: 31.5 % — ABNORMAL LOW (ref 39.0–52.0)
HEMOGLOBIN: 10.6 g/dL — AB (ref 13.0–17.0)
LYMPHS ABS: 2.7 10*3/uL (ref 0.7–4.0)
LYMPHS PCT: 35 % (ref 12–46)
MCH: 29.4 pg (ref 26.0–34.0)
MCHC: 33.7 g/dL (ref 30.0–36.0)
MCV: 87.5 fL (ref 78.0–100.0)
Monocytes Absolute: 0.7 10*3/uL (ref 0.1–1.0)
Monocytes Relative: 10 % (ref 3–12)
NEUTROS ABS: 3.9 10*3/uL (ref 1.7–7.7)
Neutrophils Relative %: 52 % (ref 43–77)
PLATELETS: 213 10*3/uL (ref 150–400)
RBC: 3.6 MIL/uL — AB (ref 4.22–5.81)
RDW: 13.5 % (ref 11.5–15.5)
WBC: 7.6 10*3/uL (ref 4.0–10.5)

## 2013-10-17 LAB — BASIC METABOLIC PANEL
BUN: 7 mg/dL (ref 6–23)
CALCIUM: 8.6 mg/dL (ref 8.4–10.5)
CO2: 24 mEq/L (ref 19–32)
Chloride: 103 mEq/L (ref 96–112)
Creatinine, Ser: 0.68 mg/dL (ref 0.50–1.35)
GFR calc Af Amer: >90 mL/min (ref >90)
Glucose, Bld: 91 mg/dL (ref 70–99)
POTASSIUM: 4.4 meq/L (ref 3.7–5.3)
SODIUM: 139 meq/L (ref 137–147)

## 2013-10-17 LAB — HEPARIN LEVEL (UNFRACTIONATED)

## 2013-10-17 NOTE — Addendum Note (Signed)
Addendum created 10/17/13 1218 by Corky Soxhris Daisee Centner, MD   Modules edited: Anesthesia Responsible Staff

## 2013-10-17 NOTE — Discharge Summary (Signed)
Physician Discharge Summary  Patient ID: Vincent Payne MRN: [id number] DOB/AGE: Jan 03, 1951 63 y.o.  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  Was [facility] at [facility] and evaluated.  Was referred to Dr Corliss Skainseveshwar for cerebral arteriogram and evaluation.  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 Also L vertebral artery stenosis.  Pt then consulted with Dr Corliss Skainseveshwar and was subsequently scheduled for cerebral arteriogram and intervention for L Vertebral artery which was performed 3/9.  This included angioplasty/stent placement in L vertebral artery using general anesthesia. Pt was admitted overnight into Neuro ICU and has done very well. This am he has no complaints. Eating and drinking well without N/V. No headache. UOP is great at 2.7 liters this am--yellow and clear.  +passing gas. Denies visual or speech issues.  Dr Corliss Skainseveshwar has seen and examined pt. Plan for dc to home now. Pt has ambulated without problem. Will continue home meds and ASA 81mg  and Plavix 75 mg. Rx for Plavix given to pt. Return to see Dr Corliss Skainseveshwar for 2 week follow up- 3/23 at 100 pm  Consults: None  Significant Diagnostic Studies: Cerebral arteriogram  Treatments: L vertebral artery angioplasty/stent placement  Discharge Exam: Blood pressure 123/59, pulse 84, temperature 97.6 F (36.4 C), temperature source Oral, resp. rate 14, height 5\' 8"  (1.727 m), weight 54 kg (119 lb 0.8 oz), SpO2 100.00%.  PE: afeb; VSS A/O Appropriate; speech wnl Smile =; face symmetrical Good strength in all 4s and = Good  sensation; ambulating Heart: RRR Lungs: CTA Abd: NT; no masses; soft Extr: FROM; = movement and strength Rt groin: NT; no bleeding; No hematoma Rt foot 2+ pulses UOP good: yellow and clear.  Results for orders placed during the hospital encounter of 10/16/13  MRSA PCR SCREENING      Result Value Ref Range   MRSA by PCR NEGATIVE  NEGATIVE  PLATELET INHIBITION P2Y12      Result Value Ref Range   Platelet Function  P2Y12 173 (*) 194 - 418 PRU  HEPARIN LEVEL (UNFRACTIONATED)      Result Value Ref Range   Heparin Unfractionated <0.10 (*) 0.30 - 0.70 IU/mL  BASIC METABOLIC PANEL      Result Value Ref Range   Sodium 139  137 - 147 mEq/L   Potassium 4.4  3.7 - 5.3 mEq/L   Chloride 103  96 - 112 mEq/L   CO2 24  19 - 32 mEq/L   Glucose, Bld 91  70 - 99 mg/dL   BUN 7  6 - 23 mg/dL   Creatinine, Ser 4.090.68  0.50 - 1.35 mg/dL   Calcium 8.6  8.4 - 81.110.5 mg/dL   GFR calc non Af Amer >90  >90 mL/min   GFR calc Af Amer >90  >90 mL/min  CBC WITH DIFFERENTIAL      Result Value Ref Range   WBC 7.6  4.0 - 10.5 K/uL   RBC 3.60 (*) 4.22 - 5.81 MIL/uL   Hemoglobin 10.6 (*) 13.0 - 17.0 g/dL   HCT 91.431.5 (*) 78.239.0 - 95.652.0 %   MCV 87.5  78.0 - 100.0 fL   MCH 29.4  26.0 - 34.0  pg   MCHC 33.7  30.0 - 36.0 g/dL   RDW 16.1  09.6 - 04.5 %   Platelets 213  150 - 400 K/uL   Neutrophils Relative % 52  43 - 77 %   Neutro Abs 3.9  1.7 - 7.7 K/uL   Lymphocytes Relative 35  12 - 46 %   Lymphs Abs 2.7  0.7 - 4.0 K/uL   Monocytes Relative 10  3 - 12 %   Monocytes Absolute 0.7  0.1 - 1.0 K/uL   Eosinophils Relative 3  0 - 5 %   Eosinophils Absolute 0.3  0.0 - 0.7 K/uL   Basophils Relative 0  0 - 1 %   Basophils Absolute 0.0  0.0 - 0.1 K/uL  HEPARIN LEVEL (UNFRACTIONATED)      Result Value Ref Range   Heparin Unfractionated <0.10 (*) 0.30 - 0.70 IU/mL    Disposition: pt with ataxia; dizziness and headaches for over 1 yr. Cerebral arteriogram was abnormal with Rt CCA and Rt VB juxn occlusion; and L VA  stenosis. L VA stenosis was treated with an Interventional Radiology procedure- angioplasty/stent placement with Dr Corliss Skains and anesthesia 10/16/13. Pt tolerated procedure well. Admitted to N ICU overnight. No complaints - no issues next am. Pt was seen and evaluated by Dr Corliss Skains 10/17/13 am. Plan for dc home now. Continue all home meds; Rx for Plavix 75 mg #30 and 1 refill given to pt. Pt has good understanding of dc instructions. Follow up in 2 weeks--3/23 at 100 in [facility]  Discharge Orders   Future Appointments Provider Department Dept Phone   10/30/2013 1:00 PM [facility] INTERVENTIONAL RADIOLOGY [phone]   Future Orders Complete By Expires   IR Radiologist Eval & Mgmt  11/17/2013 12/18/2014   Questions:     Preferred Imaging Location?:  [facility]   Reason for Exam (SYMPTOM  OR DIAGNOSIS REQUIRED):  2 week follow up with Dr Corliss Skains   Call MD for:  difficulty breathing, headache or visual disturbances  As directed    Call MD for:  extreme fatigue  As directed    Call MD for:  persistant dizziness or light-headedness  As directed    Call MD for:  persistant nausea and vomiting  As directed    Call MD for:  redness, tenderness, or signs of infection (pain, swelling, redness, odor or green/yellow discharge around incision site)  As directed    Call MD for:  severe uncontrolled pain  As directed    Call MD for:  temperature >100.4  As directed    Diet - low sodium heart healthy  As directed    Discharge instructions  As directed    Comments:     Follow up in 2 weeks with Dr Silvano Bilis for 3/23 at 100 pm at [facility] radiology; call [phone] or [phone] with any questions or concerns   Discharge wound care:  As directed    Comments:     May change bandage to bandaid; may shower starting 3/11-- replace bandaid daily   Driving Restrictions  As directed    Comments:     No driving x 2 weeks   Increase activity slowly  As directed     Lifting restrictions  As directed    Comments:     No lifting over 10 lbs x 2 weeks       Medication List         aspirin 81 MG chewable tablet  Chew 81 mg by mouth daily.     clopidogrel 75 MG tablet  Commonly known as:  PLAVIX  Take 75 mg by mouth daily with breakfast.     MULTIVITAL Chew  Chew 2 tablets by mouth daily.     OxyCODONE 10 mg T12a 12 hr tablet  Commonly known as:  OXYCONTIN  Take 10 mg by mouth 2 (two) times daily. For 10 days     oxyCODONE-acetaminophen 5-325 MG per tablet  Commonly known as:  PERCOCET/ROXICET  Take 1 tablet by mouth every 6 (six) hours as needed for moderate pain or severe pain.         Signed: Bonny Egger A 10/17/2013, 11:04 AM

## 2013-10-17 NOTE — Progress Notes (Signed)
1 Day Post-Op  Subjective: L Vertebral artery angioplasty/stent 3/9 Has done well overnight Slight frontal headache this am--Tylenol 2 po--resolved No N/V Eating well; urinating well  Objective: Vital signs in last 24 hours: Temp:  [97 F (36.1 C)-99 F (37.2 C)] 97.6 F (36.4 C) (03/10 0800) Pulse Rate:  [72-95] 84 (03/10 0700) Resp:  [10-61] 14 (03/10 0800) BP: (95-131)/(41-73) 123/59 mmHg (03/10 0800) SpO2:  [96 %-100 %] 100 % (03/10 0800) Arterial Line BP: (111-172)/(33-62) 152/48 mmHg (03/09 1700) Weight:  [54 kg (119 lb 0.8 oz)] 54 kg (119 lb 0.8 oz) (03/09 1256)    Intake/Output from previous day: 03/09 0701 - 03/10 0700 In: 2840.9 [P.O.:600; I.V.:2000.9] Out: 2750 [Urine:2700; Blood:50] Intake/Output this shift: Total I/O In: 225 [P.O.:150; I.V.:75] Out: -   PE:  Afeb; VSS Face symmetrical Smile = Good movement and strength all 4s Good sensation B Rt groin NT; no bleeding; no hematoma Rt foot 2+ pulses Labs wnl this am UOP 2.7L yellow  Lab Results:   Recent Labs  10/16/13 0921 10/17/13 0215  WBC  --  7.6  HGB 11.2* 10.6*  HCT 33.0* 31.5*  PLT  --  213   BMET  Recent Labs  10/16/13 0921 10/17/13 0215  NA 140 139  K 4.0 4.4  CL  --  103  CO2  --  24  GLUCOSE 98 91  BUN  --  7  CREATININE  --  0.68  CALCIUM  --  8.6   PT/INR No results found for this basename: LABPROT, INR,  in the last 72 hours ABG No results found for this basename: PHART, PCO2, PO2, HCO3,  in the last 72 hours  Studies/Results: No results found.  Anti-infectives: Anti-infectives   Start     Dose/Rate Route Frequency Ordered Stop   10/16/13 0630  ceFAZolin (ANCEF) IVPB 2 g/50 mL premix     2 g 100 mL/hr over 30 Minutes Intravenous  Once 10/16/13 0618 10/16/13 0929      Assessment/Plan: s/p Procedure(s): RADIOLOGY WITH ANESTHESIA (N/A)  L VA pta/stent 3/9 Has done well overnight Hep off 7am today Increase diet Plan to dc foley; ambulate to bathroom and  to chair now Will inform Dr Corliss Skainseveshwar Plan for dc today   LOS: 1 day    Paullette Mckain A 10/17/2013

## 2013-10-30 ENCOUNTER — Ambulatory Visit (HOSPITAL_COMMUNITY): Admit: 2013-10-30 | Payer: BC Managed Care – PPO

## 2013-11-06 ENCOUNTER — Ambulatory Visit (HOSPITAL_COMMUNITY)
Admission: RE | Admit: 2013-11-06 | Discharge: 2013-11-06 | Disposition: A | Discharge status: Home / Self Care | Payer: BC Managed Care – PPO | Source: Ambulatory Visit | Attending: Radiology | Admitting: Radiology

## 2013-11-06 DIAGNOSIS — I6509 Occlusion and stenosis of unspecified vertebral artery: Secondary | ICD-10-CM

## 2013-11-21 ENCOUNTER — Other Ambulatory Visit (HOSPITAL_COMMUNITY): Payer: Self-pay | Admitting: Interventional Radiology

## 2013-11-21 DIAGNOSIS — I6509 Occlusion and stenosis of unspecified vertebral artery: Secondary | ICD-10-CM

## 2013-11-21 DIAGNOSIS — H538 Other visual disturbances: Secondary | ICD-10-CM

## 2013-11-21 DIAGNOSIS — R269 Unspecified abnormalities of gait and mobility: Secondary | ICD-10-CM

## 2013-11-21 DIAGNOSIS — R42 Dizziness and giddiness: Secondary | ICD-10-CM

## 2013-11-22 ENCOUNTER — Other Ambulatory Visit (HOSPITAL_COMMUNITY): Payer: Self-pay | Admitting: Interventional Radiology

## 2013-11-27 ENCOUNTER — Ambulatory Visit (HOSPITAL_COMMUNITY): Payer: BC Managed Care – PPO

## 2013-12-04 ENCOUNTER — Ambulatory Visit (HOSPITAL_COMMUNITY)
Admission: RE | Admit: 2013-12-04 | Discharge: 2013-12-04 | Disposition: A | Discharge status: Home / Self Care | Payer: BC Managed Care – PPO | Source: Ambulatory Visit | Attending: Interventional Radiology | Admitting: Interventional Radiology

## 2013-12-04 DIAGNOSIS — H538 Other visual disturbances: Secondary | ICD-10-CM | POA: Insufficient documentation

## 2013-12-04 DIAGNOSIS — R269 Unspecified abnormalities of gait and mobility: Secondary | ICD-10-CM | POA: Insufficient documentation

## 2013-12-04 DIAGNOSIS — R42 Dizziness and giddiness: Secondary | ICD-10-CM | POA: Insufficient documentation

## 2013-12-04 DIAGNOSIS — I6509 Occlusion and stenosis of unspecified vertebral artery: Secondary | ICD-10-CM | POA: Insufficient documentation

## 2013-12-04 MED ORDER — IOHEXOL 350 MG/ML SOLN
40.0000 mL | Freq: Once | INTRAVENOUS | Status: AC | PRN
  Administered 2013-12-04: 40 mL via INTRAVENOUS

## 2014-01-18 ENCOUNTER — Other Ambulatory Visit: Payer: Self-pay | Admitting: Radiology

## 2014-01-18 DIAGNOSIS — I6529 Occlusion and stenosis of unspecified carotid artery: Secondary | ICD-10-CM

## 2014-02-01 ENCOUNTER — Ambulatory Visit (HOSPITAL_COMMUNITY)
Admission: RE | Admit: 2014-02-01 | Discharge: 2014-02-01 | Disposition: A | Discharge status: Home / Self Care | Payer: BC Managed Care – PPO | Source: Ambulatory Visit | Attending: Interventional Radiology | Admitting: Interventional Radiology

## 2014-02-01 DIAGNOSIS — I779 Disorder of arteries and arterioles, unspecified: Secondary | ICD-10-CM | POA: Insufficient documentation

## 2014-02-01 DIAGNOSIS — I6529 Occlusion and stenosis of unspecified carotid artery: Secondary | ICD-10-CM

## 2014-02-02 NOTE — Progress Notes (Signed)
VASCULAR LAB PRELIMINARY  PRELIMINARY  PRELIMINARY  PRELIMINARY  Carotid duplex completed.    Preliminary report:  Right -  Unable to fully evaluate due to plaque formation. Adequate flow in the ECA . There is possibly a high grade stenosis of the ICA. Vertebral artery flow is antegrade. Left - 1% to 39% ICA stenosis. Vertebral artery flow is antegrade.  SLAUGHTER, [place], RVS 02/02/2014, 10:14 PM

## 2014-02-06 ENCOUNTER — Telehealth (HOSPITAL_COMMUNITY): Payer: Self-pay | Admitting: Interventional Radiology

## 2014-02-06 NOTE — Telephone Encounter (Signed)
Called pt, left VM. Told him that we would contact him in 6 month's time to schedule his follow-up US and that he could call us if needed sooner. JMichaux

## 2014-12-14 IMAGING — CR DG CHEST 2V
2 series · 2 of 2 positions shown · non-contrast
Comparison: IR ANGIO INTRA EXTRACRAN SEL COM CAROTID INNOMINATE
BILAT MOD SED dated 09/19/2013; DG CHEST 2V dated 09/05/2013

CLINICAL DATA: Preop for brain stent

EXAM:
CHEST  2 VIEW

[w chest pa]
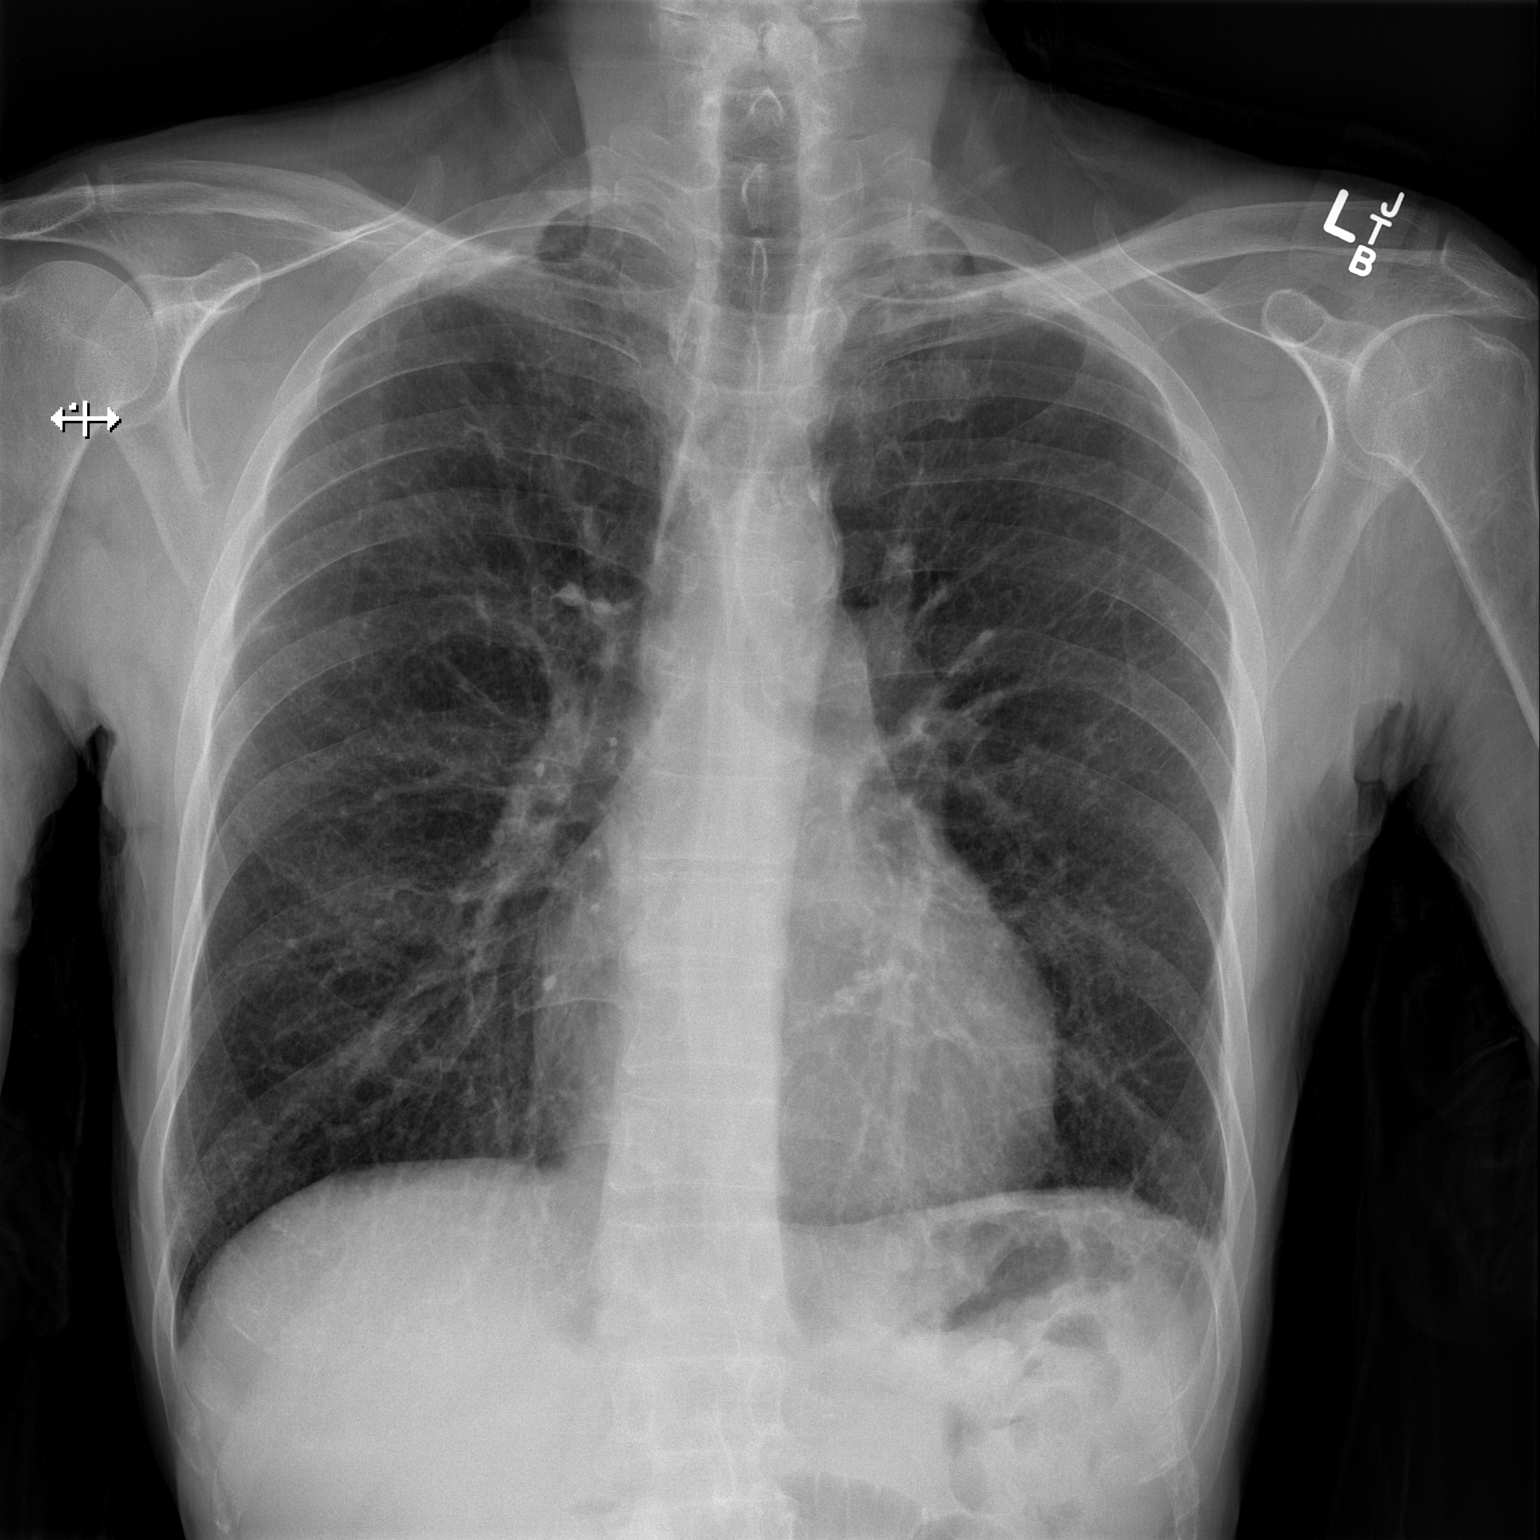

[w chest lat]
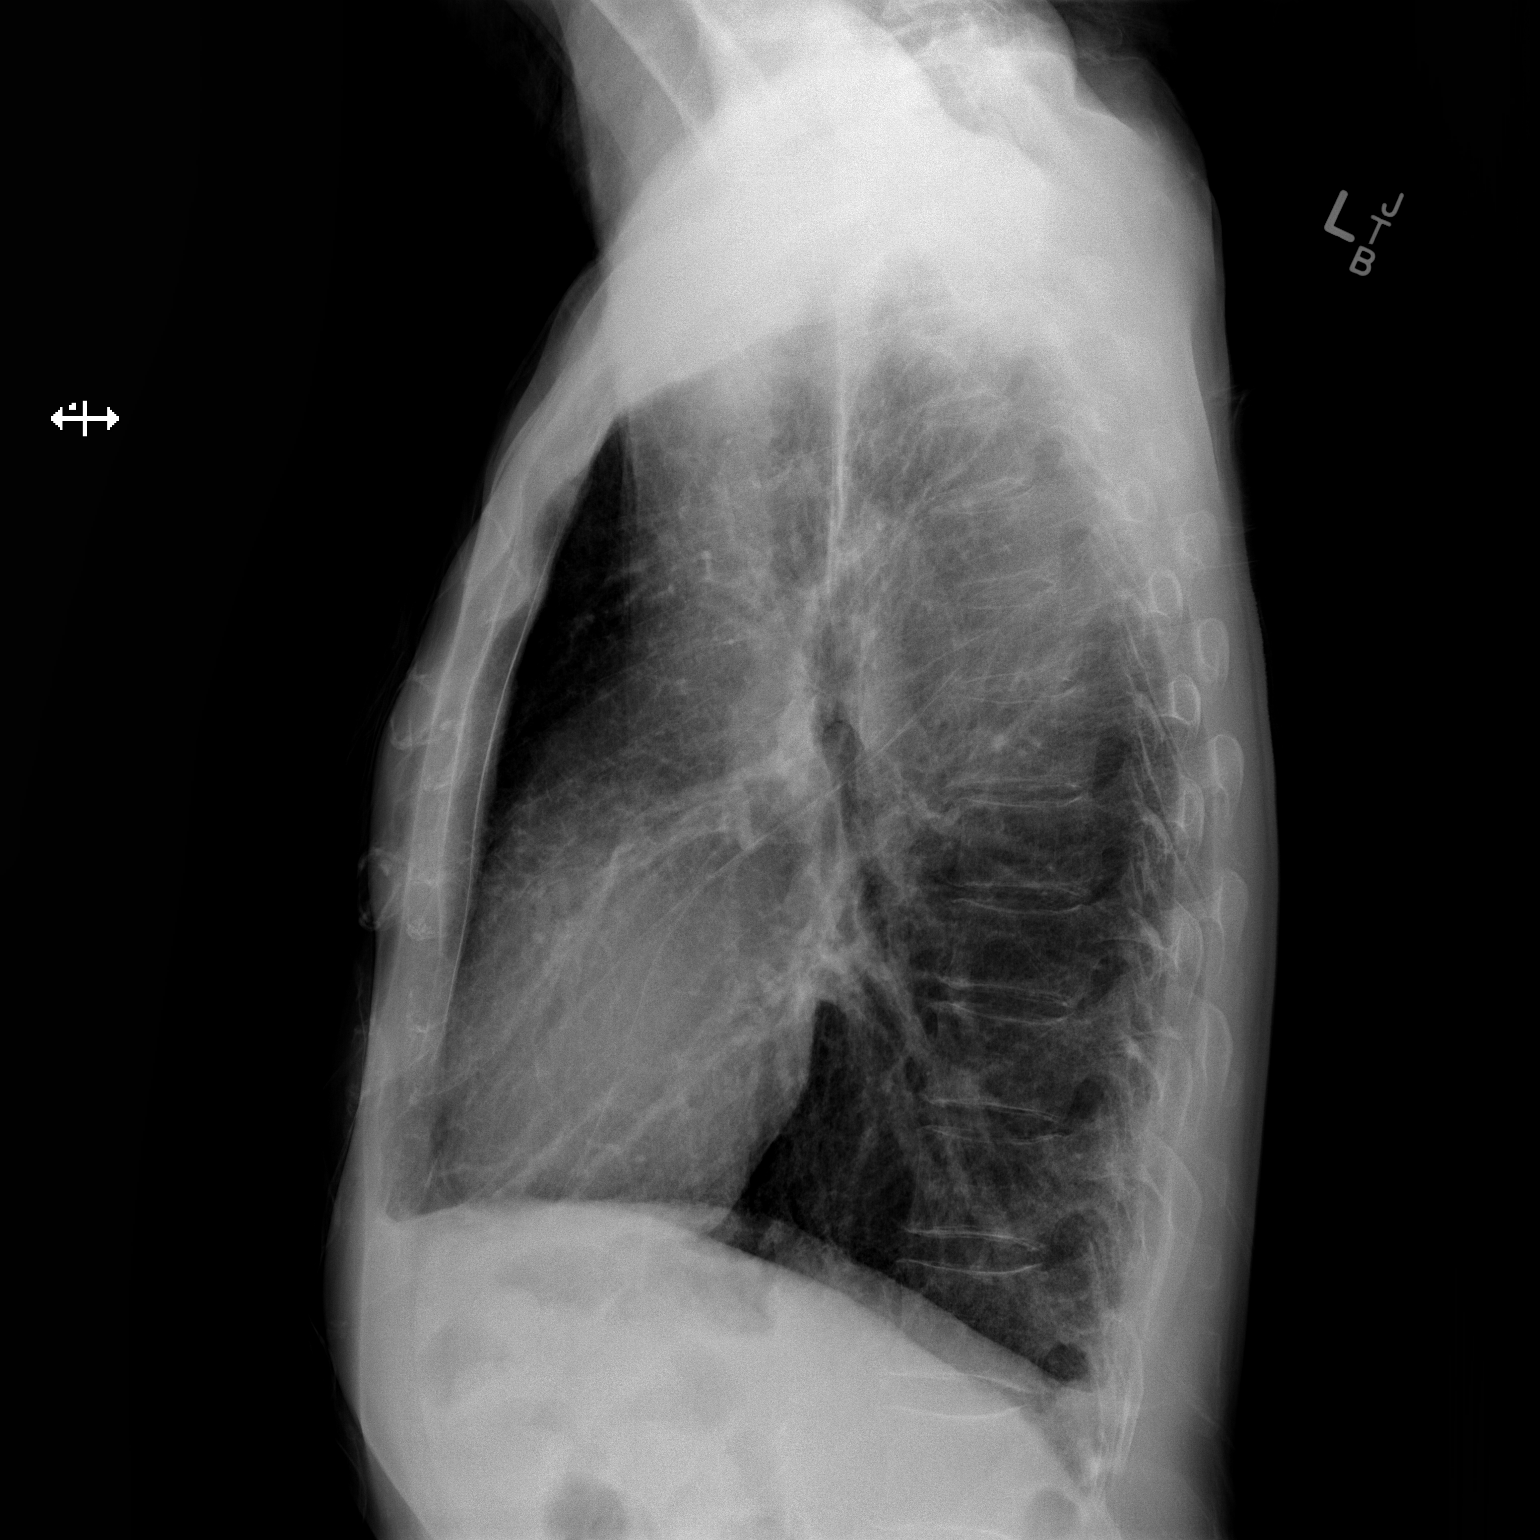

[2 of 2 positions shown; findings below may reference images not displayed]

FINDINGS: The lungs are hyperinflated likely secondary to COPD. There is no
focal parenchymal opacity, pleural effusion, or pneumothorax. The
heart and mediastinal contours are unremarkable.

The osseous structures are unremarkable.
IMPRESSION: No active cardiopulmonary disease.

## 2014-12-17 IMAGING — XA IR TRANSCATH EXCRAN VERT OR CAR A STENT
1 series · 11 of 24 positions shown · IV contrast (IODINE)
Comparison: none

[Series 300: sp pta intracranial · 11 of 122 slices shown]
[im 6/122]
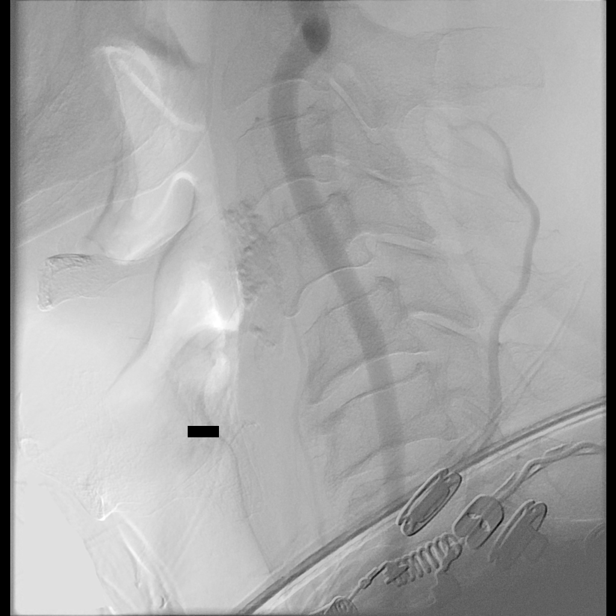
[im 16/122]
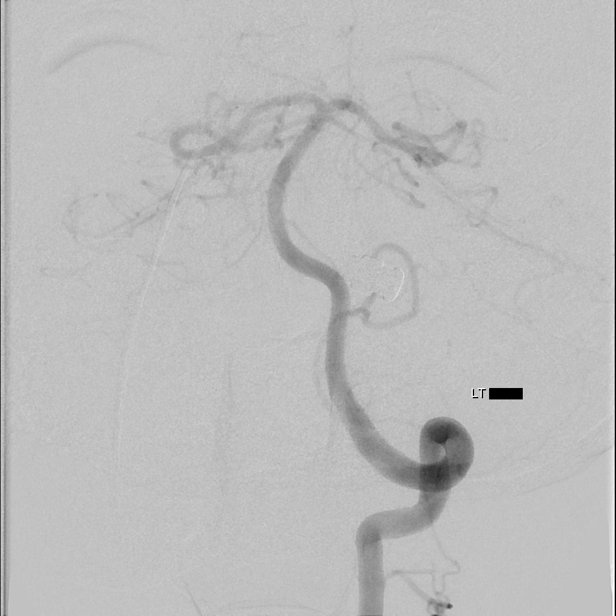
[im 27/122]
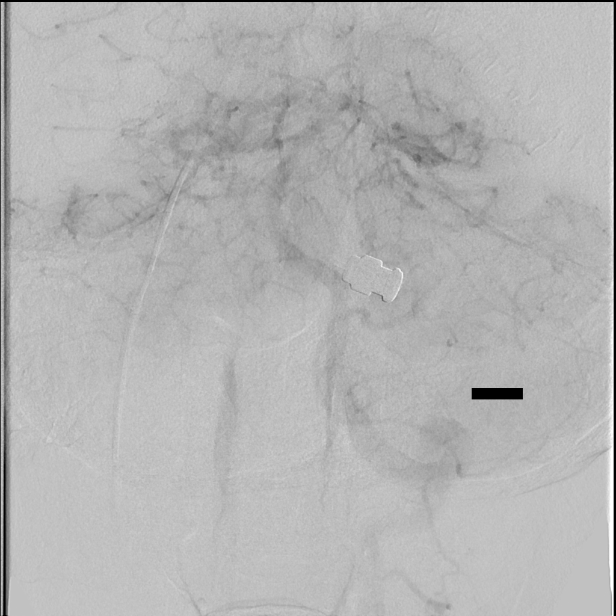
[im 37/122]
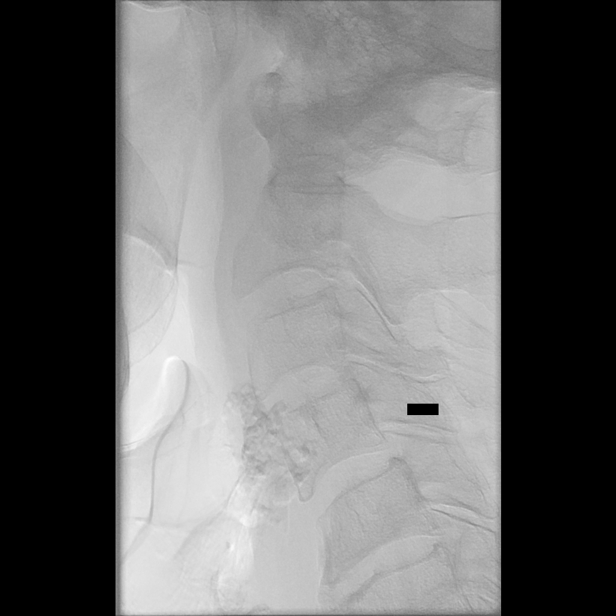
[im 48/122]
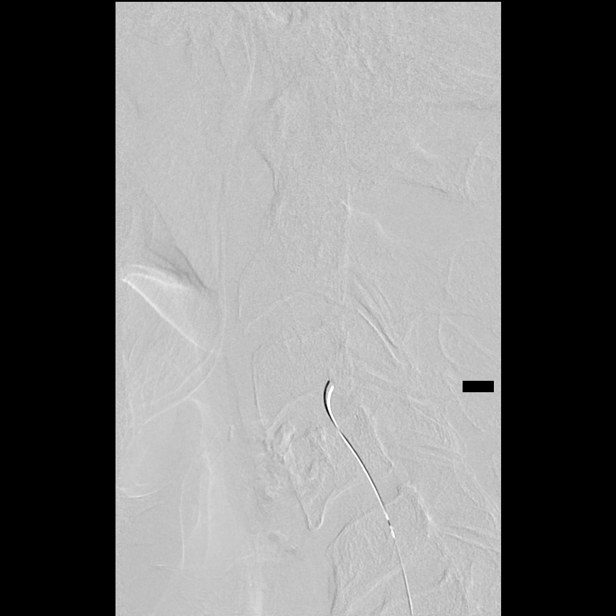
[im 64/122]
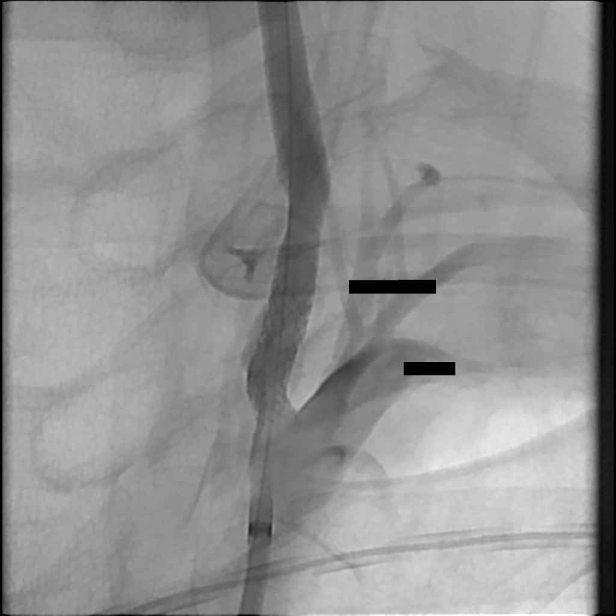
[im 74/122]
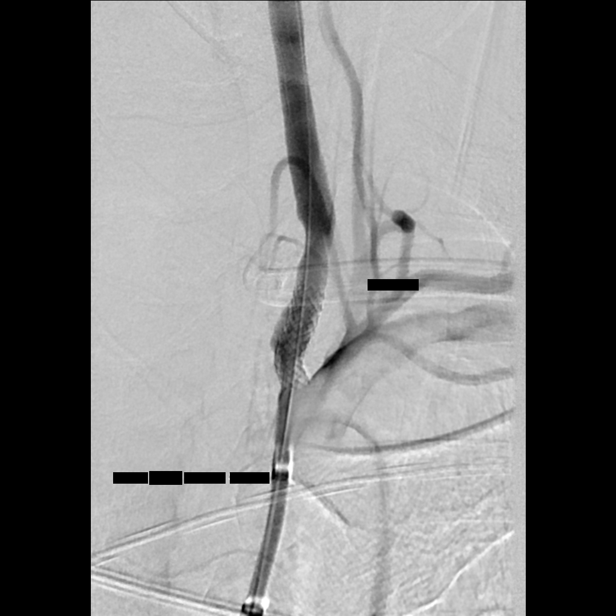
[im 85/122]
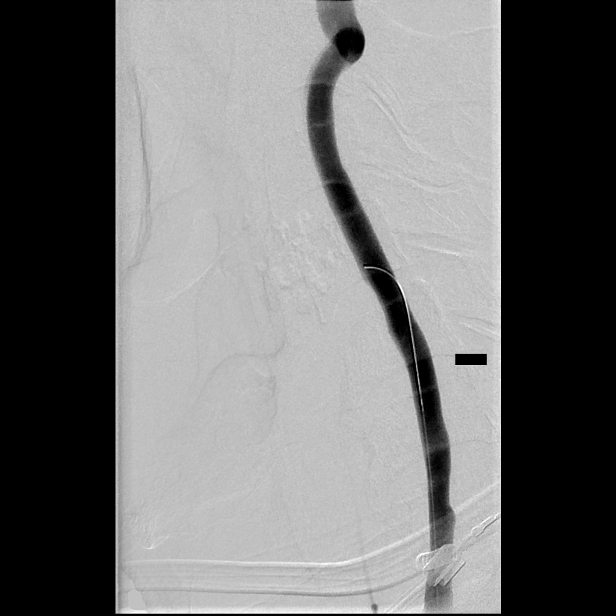
[im 95/122]
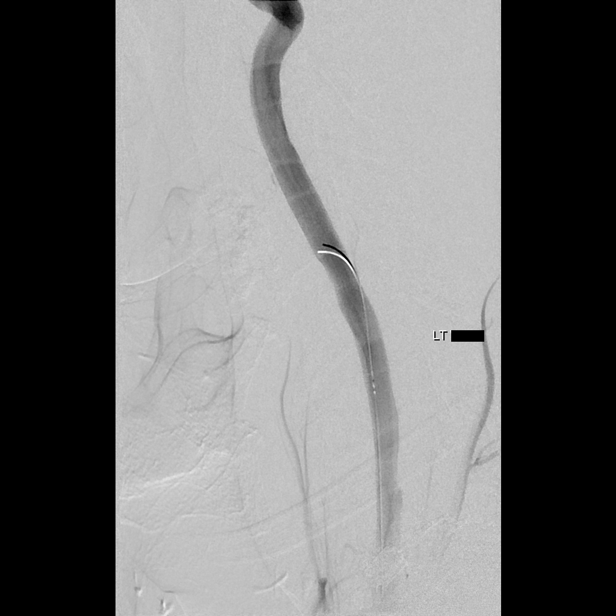
[im 106/122]
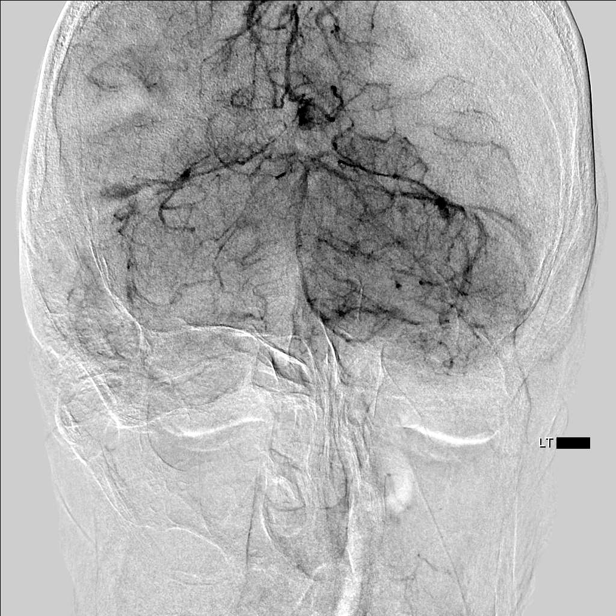
[im 116/122]
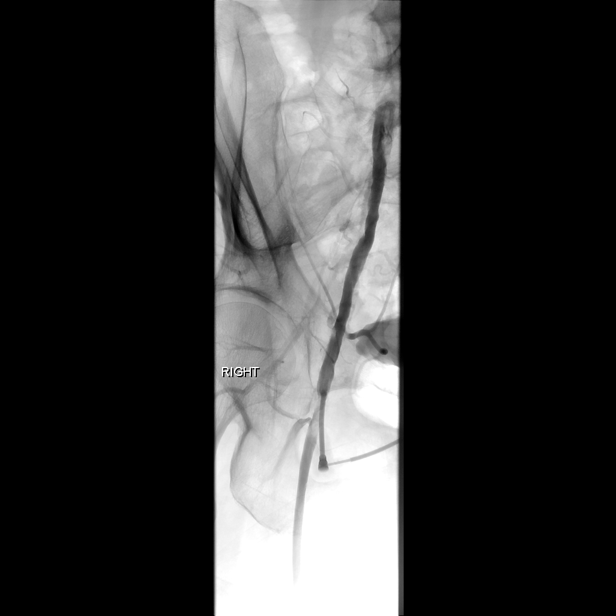

[11 of 24 positions shown; findings below may reference images not displayed]

CLINICAL DATA
Persistent vertebrobasilar ischemia. Syncope. Despite patient on
aspirin and Plavix, no known history of severe left vertebral artery
origin stenoses. Occluded right vertebral artery.

EXAM
LEFT VERTEBRAL ARTERY ARTERIOGRAM FOLLOWED BY IR TRANSCATH EXCRAN
VERT OR CAR A STENT

ANESTHESIA/SEDATION
General anesthesia.

MEDICATIONS
As per general anesthesia.

CONTRAST
90mL OMNIPAQUE IOHEXOL 300 MG/ML  SOLN.

PROCEDURE
Following a full explanation of the procedure along with the
potential associated complications, an informed witnessed consent
was obtained. The patient was put under general anesthesia by the
[REDACTED] at [HOSPITAL].

The right groin was prepped and draped in the usual sterile fashion.
Thereafter using a modified Seldinger technique, transfemoral access
to right common femoral artery was obtained without difficulty. Over
a 0.035 inch guidewire, a 5 French Pinnacle sheath was inserted.
Through this, and also over a 0.035 inch guidewire, 5 French JB1
catheter was advanced to the aortic arch region and selectively
positioned in the left subclavian artery proximal to the origin of
the left vertebral artery. This demonstrated again a tight focal
eccentric stenoses of the origin of the left vertebral artery of
approximately 70 to 75%.

The intracranial DSA again demonstrated brisk flow into the left
vertebrobasilar junction and the left posterior-inferior cerebellar
artery.

The basilar artery, the posterior cerebral arteries, the superior
cerebellar arteries and the anterior inferior cerebellar arteries
opacify normally into the capillary and the venous phases. No inflow
of flow is seen from the contralateral vertebrobasilar junction. The
diagnostic JB1 catheter in the left subclavian artery was then
exchanged over 0.035 inch 300 cm Rosen exchange guidewire for a 6
French 65 cm neurovascular arrow sheath using biplane roadmap
technique, and constant fluoroscopic guidance. A gentle contrast
injection demonstrated no evidence of spasm, dissections or of
intraluminal filling defects. Over the Rosen exchange guidewire, a
90 cm 6 French Envoy Brite Tip guide catheter was advanced and
positioned just proximal to the origin of the left vertebral artery.

The guidewire was removed. Good aspiration was obtained from the hub
of the a 6-French guide catheter.

General contrast injection demonstrated no evidence of spasm,
dissections or of intraluminal filling defects.

At this time in a coaxial manner and with constant heparinized
saline infusion, over a 0.014 inch transient Softtip micro
guidewire, a rapid Transit 2 tip microcatheter which had been
steamed shaped was then advanced through the left vertebral artery
stenoses and advanced to the distal vertical segment of the left
vertebral internal carotid artery.

The guidewire was removed. Good aspiration was obtained from the hub
of the microcatheter.

This microcatheter was then exchanged out for a 0.014 inch Softtip
300 cm Transend exchange micro guidewire using biplane roadmap
technique and constant fluoroscopic guidance. A control arteriogram
was perform through the 6 French guide catheter demonstrates safe
position of the distal aspect of the exchange micro guidewire.

At this time a 4 mm x 12 mm coronary balloon mounted stent which had
been prepped and purged retrogradely with heparinized saline fusion
was advanced in a coaxial manner and with constant heparinized
fusion using biplane roadmap technique and constant fluoroscopic
guidance using the rapid exchange system, and positioned optimally
across the tight narrowing of the origin of the left vertebral
artery.

The stent markers were then positioned optimally such that there was
adequate coverage of the origin of the vertebral artery and just
proximal to that.

After having asked the patient to hold his breath, this was then
deployed using a microinflation syringe device via micro tubing
under constant fluoroscopic guidance. The balloon was expanded to 12
atmospheres where it was maintained for about 15 seconds. The
balloon was then deflated and retrieved. A control arteriogram
performed through the 6 French guide catheter demonstrated excellent
apposition of the stent and coverage of the neck of the aneurysm.

There was a less than 10% residual narrowing at the site of the
tight narrowing.

Control arteriograms were then performed at [DATE] and 30 min post
placement of the stent. These continue to demonstrate excellent flow
through the stented segment. No evidence of intraluminal filling
defect was seen.

Intracranially the vasculature remained widely patent without
evidence of filling defects. Clinically the patient remained stable.
The patient's ACT was maintained in the region of approximately 180
seconds throughout the procedure.

The exchanged micro guidewire was then gently retrieved under
constant fluoroscopic guidance and showed no entanglement with the
stent. None was observed.

The 6 French neurovascular sheath and a 6 French guide catheter were
then retrieved into the abdominal aorta and exchanged over a J-tip
guidewire for a 6 French neurovascular sheath. This was then
successfully removed with the placement of an external closure
device.

The patient was then transported to the neuro ICU for overnight
observations, and to continue IV heparin with tight control of his
blood pressure.

The patient's overnight stay was unremarkable.

The patient was able to eat normally and ambulate independently the
following morning.

The patient was switched to Plavix 75 mg once a day, and aspirin 325
mg once a day. He was instructed to increase his water intake to
maintain hydration.

He was then discharged home under the care of his wife. He will
return for a follow-up visit in 2 weeks time.

COMPLICATIONS
None immediate

FINDINGS
As above.

IMPRESSION
Status post endovascular stent assisted angioplasty of symptomatic
tight stenoses at the origin of the dominant left vertebral artery,
having failed aggressive medical management.

SIGNATURE

## 2016-12-08 DEATH — deceased
